# Patient Record
Sex: Female | Born: 1960 | Race: White | Hispanic: No | Marital: Married | Smoking: Never smoker
Health system: Southern US, Community
[De-identification: ages and names within clinical notes are randomized; demographics above are authoritative.]

## PROBLEM LIST (undated history)

## (undated) DIAGNOSIS — M1711 Unilateral primary osteoarthritis, right knee: Secondary | ICD-10-CM

## (undated) DIAGNOSIS — G4733 Obstructive sleep apnea (adult) (pediatric): Secondary | ICD-10-CM

## (undated) DIAGNOSIS — Z87442 Personal history of urinary calculi: Secondary | ICD-10-CM

## (undated) DIAGNOSIS — F329 Major depressive disorder, single episode, unspecified: Secondary | ICD-10-CM

## (undated) DIAGNOSIS — E079 Disorder of thyroid, unspecified: Secondary | ICD-10-CM

## (undated) DIAGNOSIS — K219 Gastro-esophageal reflux disease without esophagitis: Secondary | ICD-10-CM

## (undated) DIAGNOSIS — B1920 Unspecified viral hepatitis C without hepatic coma: Secondary | ICD-10-CM

## (undated) DIAGNOSIS — F32A Depression, unspecified: Secondary | ICD-10-CM

## (undated) DIAGNOSIS — M549 Dorsalgia, unspecified: Secondary | ICD-10-CM

## (undated) DIAGNOSIS — E039 Hypothyroidism, unspecified: Secondary | ICD-10-CM

## (undated) DIAGNOSIS — R51 Headache: Secondary | ICD-10-CM

## (undated) DIAGNOSIS — Z8719 Personal history of other diseases of the digestive system: Secondary | ICD-10-CM

## (undated) DIAGNOSIS — F419 Anxiety disorder, unspecified: Secondary | ICD-10-CM

## (undated) DIAGNOSIS — J189 Pneumonia, unspecified organism: Secondary | ICD-10-CM

## (undated) DIAGNOSIS — M1712 Unilateral primary osteoarthritis, left knee: Secondary | ICD-10-CM

## (undated) HISTORY — DX: Unilateral primary osteoarthritis, right knee: M17.11

## (undated) HISTORY — PX: ABDOMINAL HYSTERECTOMY: SHX81

## (undated) HISTORY — DX: Dorsalgia, unspecified: M54.9

## (undated) HISTORY — DX: Unspecified viral hepatitis C without hepatic coma: B19.20

## (undated) HISTORY — DX: Depression, unspecified: F32.A

## (undated) HISTORY — PX: COLONOSCOPY: SHX174

## (undated) HISTORY — PX: KNEE ARTHROSCOPY: SUR90

## (undated) HISTORY — DX: Gastro-esophageal reflux disease without esophagitis: K21.9

## (undated) HISTORY — DX: Disorder of thyroid, unspecified: E07.9

## (undated) HISTORY — DX: Obstructive sleep apnea (adult) (pediatric): G47.33

## (undated) HISTORY — DX: Anxiety disorder, unspecified: F41.9

## (undated) HISTORY — DX: Major depressive disorder, single episode, unspecified: F32.9

## (undated) HISTORY — PX: TONSILLECTOMY: SUR1361

## (undated) HISTORY — PX: KNEE ARTHROSCOPY: SHX127

---
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
## 1997-01-06 HISTORY — PX: RADICAL HYSTERECTOMY: SHX2283

## 1997-01-06 HISTORY — PX: KNEE ARTHROSCOPY: SUR90

## 2003-01-07 HISTORY — PX: CHOLECYSTECTOMY: SHX55

## 2007-07-28 ENCOUNTER — Encounter: Admission: RE | Admit: 2007-07-28 | Discharge: 2007-07-28 | Payer: Self-pay | Admitting: Orthopedic Surgery

## 2008-01-07 HISTORY — PX: KNEE ARTHROSCOPY: SHX127

## 2012-11-10 ENCOUNTER — Other Ambulatory Visit: Payer: Self-pay | Admitting: Physician Assistant

## 2012-11-10 ENCOUNTER — Encounter: Payer: Self-pay | Admitting: Physician Assistant

## 2012-11-10 DIAGNOSIS — F419 Anxiety disorder, unspecified: Secondary | ICD-10-CM

## 2012-11-10 DIAGNOSIS — G4733 Obstructive sleep apnea (adult) (pediatric): Secondary | ICD-10-CM

## 2012-11-10 DIAGNOSIS — E079 Disorder of thyroid, unspecified: Secondary | ICD-10-CM | POA: Insufficient documentation

## 2012-11-10 DIAGNOSIS — B1920 Unspecified viral hepatitis C without hepatic coma: Secondary | ICD-10-CM | POA: Insufficient documentation

## 2012-11-10 DIAGNOSIS — M549 Dorsalgia, unspecified: Secondary | ICD-10-CM | POA: Insufficient documentation

## 2012-11-10 DIAGNOSIS — K219 Gastro-esophageal reflux disease without esophagitis: Secondary | ICD-10-CM | POA: Insufficient documentation

## 2012-11-10 DIAGNOSIS — M1711 Unilateral primary osteoarthritis, right knee: Secondary | ICD-10-CM | POA: Insufficient documentation

## 2012-11-10 NOTE — H&P (Addendum)
TOTAL KNEE ADMISSION H&P  Patient is being admitted for right total knee arthroplasty.  Subjective:  Chief Complaint:right knee pain.  HPI: [name] Mcgee, 52 y.o. female, has a history of pain and functional disability in the right knee due to arthritis and has failed non-surgical conservative treatments for greater than 12 weeks to includeNSAID's and/or analgesics, corticosteriod injections, viscosupplementation injections, flexibility and strengthening excercises, use of assistive devices, weight reduction as appropriate and activity modification.  Onset of symptoms was gradual, starting >10 years ago with gradually worsening course since that time. The patient noted prior procedures on the knee to include  arthroscopy and menisectomy on the right knee(s).  Patient currently rates pain in the right knee(s) at 10 out of 10 with activity. Patient has night pain, worsening of pain with activity and weight bearing, pain that interferes with activities of daily living, crepitus and joint swelling.  Patient has evidence of subchondral sclerosis, periarticular osteophytes and joint space narrowing by imaging studies.  There is no active infection.  Patient Active Problem List   Diagnosis Date Noted  . Back pain   . Right knee DJD   . Thyroid disease   . Hepatitis C   . GERD (gastroesophageal reflux disease)   . Anxiety   . Sleep apnea, obstructive    Past Medical History  Diagnosis Date  . Back pain   . Right knee DJD   . Thyroid disease   . Depression   . Hepatitis C   . GERD (gastroesophageal reflux disease)   . Anxiety   . Sleep apnea, obstructive     wears a CPAP at night    Past Surgical History  Procedure Laterality Date  . Cholecystectomy  2005  . Radical hysterectomy  1999  . Knee arthroscopy Right 1999  . Knee arthroscopy Right   . Knee arthroscopy Right 2010  . Knee arthroscopy Left   . Tonsilectomy/adenoidectomy with myringotomy  1975     (Not in a hospital  admission) No Known Allergies    Current outpatient prescriptions:ALPRAZolam (XANAX) 1 MG tablet, Take 1 mg by mouth 3 (three) times daily., Disp: , Rfl: ;  amitriptyline (ELAVIL) 10 MG tablet, Take 20 mg by mouth at bedtime., Disp: , Rfl: ;  ARIPiprazole (ABILIFY) 15 MG tablet, Take 15 mg by mouth daily., Disp: , Rfl: ;  DULoxetine (CYMBALTA) 60 MG capsule, Take 60 mg by mouth 2 (two) times daily., Disp: , Rfl:  gabapentin (NEURONTIN) 800 MG tablet, Take 800 mg by mouth 4 (four) times daily - after meals and at bedtime., Disp: , Rfl: ;  HYDROcodone-acetaminophen (NORCO/VICODIN) 5-325 MG per tablet, Take 1 tablet by mouth every 6 (six) hours as needed for moderate pain., Disp: , Rfl: ;  lansoprazole (PREVACID) 30 MG capsule, Take 30 mg by mouth daily at 12 noon., Disp: , Rfl:  levothyroxine (SYNTHROID, LEVOTHROID) 112 MCG tablet, Take 112 mcg by mouth daily before breakfast., Disp: , Rfl:   History  Substance Use Topics  . Smoking status: Never Smoker   . Smokeless tobacco: Not on file  . Alcohol Use: Yes     Comment: social    Family History  Problem Relation Age of Onset  . Heart disease Father   . Heart attack Father   . Hypertension Father   . Diabetes Father   . Kidney disease Father   . Heart attack Maternal Grandfather      Review of Systems  Constitutional: Negative.   HENT: Negative.   Eyes:   Negative.   Respiratory: Negative.   Cardiovascular: Negative.   Genitourinary: Negative.   Musculoskeletal: Positive for back pain and joint pain.  Skin: Negative.   Neurological: Negative.   Endo/Heme/Allergies: Negative.   Psychiatric/Behavioral: Positive for depression. Negative for suicidal ideas, hallucinations, memory loss and substance abuse. The patient is nervous/anxious. The patient does not have insomnia.     Objective:  Physical Exam  Constitutional: She is oriented to person, place, and time. She appears well-developed and well-nourished.  HENT:  Head:  Normocephalic and atraumatic.  Mouth/Throat: Oropharynx is clear and moist.  Eyes: Conjunctivae are normal. Pupils are equal, round, and reactive to light.  Neck: Normal range of motion. Neck supple.  Cardiovascular: Normal rate, regular rhythm and normal heart sounds.   Respiratory: Effort normal and breath sounds normal.  GI: Soft. Bowel sounds are normal.  Genitourinary:  Not pertinent to current symptomatology therefore not examined.  Musculoskeletal:  She ambulates with a moderate antalgic gait. She has active range of motion -5-110 degrees bilaterally 1+ crepitus bilaterally 1+ synovitis bilaterally medial joint line tenderness bilaterally normal patella tracking bilaterally 2+dorsalis pedis pulses bilaterally.  Neurological: She is alert and oriented to person, place, and time.  Skin: Skin is warm and dry.  Psychiatric: She has a normal mood and affect. Her behavior is normal.    Vital signs in last 24 hours: Last recorded: 11/05 1300   BP: 139/97 Pulse: 104  Temp: 97.8 F (36.6 C)    Height: 5' 3" (1.6 m) SpO2: 98  Weight: 121.564 kg (268 lb)     Labs:   Estimated body mass index is 47.49 kg/(m^2) as calculated from the following:   Height as of this encounter: 5' 3" (1.6 m).   Weight as of this encounter: 121.564 kg (268 lb).   Imaging Review Plain radiographs demonstrate severe degenerative joint disease of the right knee(s). The overall alignment ismild varus. The bone quality appears to be good for age and reported activity level.  Assessment/Plan:  End stage arthritis, right knee   The patient history, physical examination, clinical judgment of the provider and imaging studies are consistent with end stage degenerative joint disease of the right knee(s) and total knee arthroplasty is deemed medically necessary. The treatment options including medical management, injection therapy arthroscopy and arthroplasty were discussed at length. The risks and benefits of  total knee arthroplasty were presented and reviewed. The risks due to aseptic loosening, infection, stiffness, patella tracking problems, thromboembolic complications and other imponderables were discussed. The patient acknowledged the explanation, agreed to proceed with the plan and consent was signed. Patient is being admitted for inpatient treatment for surgery, pain control, PT, OT, prophylactic antibiotics, VTE prophylaxis, progressive ambulation and ADL's and discharge planning. The patient is planning to be discharged home with home health services  Taurus Willis A. Daianna Vasques, PA-C Physician Assistant [facility] [phone]  11/10/2012, 2:18 PM   

## 2012-11-15 ENCOUNTER — Encounter (HOSPITAL_COMMUNITY): Payer: Self-pay

## 2012-11-16 ENCOUNTER — Other Ambulatory Visit (HOSPITAL_COMMUNITY): Payer: Self-pay | Admitting: *Deleted

## 2012-11-16 ENCOUNTER — Encounter (HOSPITAL_COMMUNITY)
Admission: RE | Admit: 2012-11-16 | Discharge: 2012-11-16 | Disposition: A | Discharge status: Home / Self Care | Payer: No Typology Code available for payment source | Source: Ambulatory Visit | Attending: Orthopedic Surgery | Admitting: Orthopedic Surgery

## 2012-11-16 ENCOUNTER — Encounter (HOSPITAL_COMMUNITY): Payer: Self-pay

## 2012-11-16 ENCOUNTER — Ambulatory Visit (HOSPITAL_COMMUNITY)
Admission: RE | Admit: 2012-11-16 | Discharge: 2012-11-16 | Disposition: A | Discharge status: Home / Self Care | Payer: No Typology Code available for payment source | Source: Ambulatory Visit | Attending: Physician Assistant | Admitting: Physician Assistant

## 2012-11-16 DIAGNOSIS — G4733 Obstructive sleep apnea (adult) (pediatric): Secondary | ICD-10-CM | POA: Insufficient documentation

## 2012-11-16 DIAGNOSIS — E039 Hypothyroidism, unspecified: Secondary | ICD-10-CM | POA: Insufficient documentation

## 2012-11-16 DIAGNOSIS — K219 Gastro-esophageal reflux disease without esophagitis: Secondary | ICD-10-CM | POA: Insufficient documentation

## 2012-11-16 DIAGNOSIS — Z01810 Encounter for preprocedural cardiovascular examination: Secondary | ICD-10-CM | POA: Insufficient documentation

## 2012-11-16 HISTORY — DX: Personal history of urinary calculi: Z87.442

## 2012-11-16 HISTORY — DX: Headache: R51

## 2012-11-16 HISTORY — DX: Personal history of other diseases of the digestive system: Z87.19

## 2012-11-16 HISTORY — DX: Hypothyroidism, unspecified: E03.9

## 2012-11-16 LAB — COMPREHENSIVE METABOLIC PANEL
ALT: 25 U/L (ref 0–35)
Albumin: 3.9 g/dL (ref 3.5–5.2)
Alkaline Phosphatase: 120 U/L — ABNORMAL HIGH (ref 39–117)
BUN: 10 mg/dL (ref 6–23)
CO2: 28 mEq/L (ref 19–32)
Chloride: 101 mEq/L (ref 96–112)
GFR calc Af Amer: 83 mL/min — ABNORMAL LOW (ref >90)
Glucose, Bld: 72 mg/dL (ref 70–99)
Potassium: 4.5 mEq/L (ref 3.5–5.1)
Sodium: 140 mEq/L (ref 135–145)
Total Bilirubin: 0.2 mg/dL — ABNORMAL LOW (ref 0.3–1.2)

## 2012-11-16 LAB — CBC WITH DIFFERENTIAL/PLATELET
Basophils Absolute: 0 10*3/uL (ref 0.0–0.1)
Eosinophils Relative: 8 % — ABNORMAL HIGH (ref 0–5)
Hemoglobin: 12 g/dL (ref 12.0–15.0)
Lymphocytes Relative: 29 % (ref 12–46)
Lymphs Abs: 1.7 10*3/uL (ref 0.7–4.0)
MCH: 27.9 pg (ref 26.0–34.0)
MCV: 85.3 fL (ref 78.0–100.0)
Monocytes Relative: 8 % (ref 3–12)
Neutro Abs: 3.2 10*3/uL (ref 1.7–7.7)
Neutrophils Relative %: 55 % (ref 43–77)
Platelets: 281 10*3/uL (ref 150–400)
RBC: 4.3 MIL/uL (ref 3.87–5.11)
RDW: 14.7 % (ref 11.5–15.5)
WBC: 5.8 10*3/uL (ref 4.0–10.5)

## 2012-11-16 LAB — TYPE AND SCREEN: Antibody Screen: NEGATIVE

## 2012-11-16 LAB — APTT: aPTT: 30 seconds (ref 24–37)

## 2012-11-16 LAB — URINALYSIS, ROUTINE W REFLEX MICROSCOPIC
Bilirubin Urine: NEGATIVE
Hgb urine dipstick: NEGATIVE
Nitrite: NEGATIVE
Specific Gravity, Urine: 1.008 (ref 1.005–1.030)
Urobilinogen, UA: 0.2 mg/dL (ref 0.0–1.0)
pH: 7 (ref 5.0–8.0)

## 2012-11-16 LAB — SURGICAL PCR SCREEN
MRSA, PCR: NEGATIVE
Staphylococcus aureus: NEGATIVE

## 2012-11-16 LAB — ABO/RH: ABO/RH(D): A POS

## 2012-11-16 LAB — PROTIME-INR: Prothrombin Time: 12.1 seconds (ref 11.6–15.2)

## 2012-11-16 NOTE — Progress Notes (Signed)
Called office re: ted hose too small for patient.

## 2012-11-16 NOTE — Pre-Procedure Instructions (Addendum)
[name] Mcgee  11/16/2012   Your procedure is scheduled on:  11/22/12  Report to [facility] short stay admitting at 1045 AM.  Call this number if you have problems the morning of surgery: [phone]   Remember:   Do not eat food or drink liquids after midnight.   Take these medicines the morning of surgery with A SIP OF WATER: xanax if need, abilify,cymbalta,gabapentin,pain med if need, prevcid,levothyroxine                  STOP all herbel meds, nsaids (aleve,naproxen,advil,ibuprofen) 5 days prior to surgery   Do not wear jewelry, make-up or nail polish.  Do not wear lotions, powders, or perfumes. You may wear deodorant.  Do not shave 48 hours prior to surgery. Men may shave face and neck.  Do not bring valuables to the hospital.  [facility] is not responsible                  for any belongings or valuables.               Contacts, dentures or bridgework may not be worn into surgery.  Leave suitcase in the car. After surgery it may be brought to your room.  For patients admitted to the hospital, discharge time is determined by your                treatment team.               Patients discharged the day of surgery will not be allowed to drive  home.  Name and phone number of your driver:   Special Instructions: Shower using CHG 2 nights before surgery and the night before surgery.  If you shower the day of surgery use CHG.  Use special wash - you have one bottle of CHG for all showers.  You should use approximately 1/3 of the bottle for each shower.   Please read over the following fact sheets that you were given: Pain Booklet, Coughing and Deep Breathing, Blood Transfusion Information, Total Joint Packet, MRSA Information and Surgical Site Infection Prevention

## 2012-11-16 NOTE — Progress Notes (Addendum)
req'd sleep study [facility] req'd ekg, office notes from dr mark Leary Roca [facility]

## 2012-11-17 LAB — URINE CULTURE

## 2012-11-17 NOTE — Progress Notes (Signed)
Anesthesia Chart Review:  Patient is a 52 year old female scheduled for right TKR on 11/22/12 by Dr. Thurston Hole.  History includes morbid obesity, non-smoker, Hepatitis C, GERD, hypothyroidism, anxiety, depression, hiatal hernia, nephrolithiasis, headaches, OSA, hysterectomy, T&A, cholecystectomy. PCP is with [facility] in [place], [place].  Preoperative labs and CXR noted.  EKG on 11/16/12 showed NSR, cannot rule out anterior infarct (age undetermined). There are no comparison EKG in Epic/Muse and none received from Dr. Thomes Lolling office.  No CV symptoms were documented at her PAT visit.  She is obese, but no known history of DM, CAD/MI, or CHF.  She will be further evaluated by her assigned anesthesiologist on the day of surgery, but if she remains asymptomatic from a CV standpoint then I would anticipate that she could proceed as planned.  Velna Ochs [facility]/Anesthesiology Phone [phone] 11/17/2012 6:42 PM

## 2012-11-19 NOTE — Progress Notes (Signed)
Notified patient of time change and instructed her to arrive at 900 am. 11/22/12

## 2012-11-21 MED ORDER — DEXTROSE 5 % IV SOLN
3.0000 g | INTRAVENOUS | Status: DC
  Filled 2012-11-21: qty 3000

## 2012-11-22 ENCOUNTER — Inpatient Hospital Stay (HOSPITAL_COMMUNITY)
Admission: RE | Admit: 2012-11-22 | Discharge: 2012-11-23 | DRG: 470 | Disposition: A | Discharge status: Home Health | Payer: No Typology Code available for payment source | Source: Ambulatory Visit | Attending: Orthopedic Surgery | Admitting: Orthopedic Surgery

## 2012-11-22 ENCOUNTER — Inpatient Hospital Stay (HOSPITAL_COMMUNITY): Payer: No Typology Code available for payment source | Admitting: Anesthesiology

## 2012-11-22 ENCOUNTER — Encounter (HOSPITAL_COMMUNITY): Payer: Self-pay

## 2012-11-22 ENCOUNTER — Encounter (HOSPITAL_COMMUNITY): Payer: No Typology Code available for payment source | Admitting: Vascular Surgery

## 2012-11-22 ENCOUNTER — Encounter (HOSPITAL_COMMUNITY)
Admission: RE | Disposition: A | Discharge status: Home Health | Payer: Self-pay | Source: Ambulatory Visit | Attending: Orthopedic Surgery

## 2012-11-22 DIAGNOSIS — M1711 Unilateral primary osteoarthritis, right knee: Secondary | ICD-10-CM | POA: Diagnosis present

## 2012-11-22 DIAGNOSIS — M1710 Unilateral primary osteoarthritis, unspecified knee: Principal | ICD-10-CM | POA: Diagnosis present

## 2012-11-22 DIAGNOSIS — M549 Dorsalgia, unspecified: Secondary | ICD-10-CM | POA: Diagnosis present

## 2012-11-22 DIAGNOSIS — F3289 Other specified depressive episodes: Secondary | ICD-10-CM | POA: Diagnosis present

## 2012-11-22 DIAGNOSIS — E079 Disorder of thyroid, unspecified: Secondary | ICD-10-CM | POA: Diagnosis present

## 2012-11-22 DIAGNOSIS — Z8249 Family history of ischemic heart disease and other diseases of the circulatory system: Secondary | ICD-10-CM

## 2012-11-22 DIAGNOSIS — F419 Anxiety disorder, unspecified: Secondary | ICD-10-CM | POA: Diagnosis present

## 2012-11-22 DIAGNOSIS — F329 Major depressive disorder, single episode, unspecified: Secondary | ICD-10-CM | POA: Diagnosis present

## 2012-11-22 DIAGNOSIS — G4733 Obstructive sleep apnea (adult) (pediatric): Secondary | ICD-10-CM | POA: Diagnosis present

## 2012-11-22 DIAGNOSIS — Z833 Family history of diabetes mellitus: Secondary | ICD-10-CM

## 2012-11-22 DIAGNOSIS — Z87442 Personal history of urinary calculi: Secondary | ICD-10-CM

## 2012-11-22 DIAGNOSIS — B1920 Unspecified viral hepatitis C without hepatic coma: Secondary | ICD-10-CM | POA: Diagnosis present

## 2012-11-22 DIAGNOSIS — K219 Gastro-esophageal reflux disease without esophagitis: Secondary | ICD-10-CM | POA: Diagnosis present

## 2012-11-22 DIAGNOSIS — F411 Generalized anxiety disorder: Secondary | ICD-10-CM | POA: Diagnosis present

## 2012-11-22 DIAGNOSIS — E039 Hypothyroidism, unspecified: Secondary | ICD-10-CM | POA: Diagnosis present

## 2012-11-22 HISTORY — PX: TOTAL KNEE ARTHROPLASTY: SHX125

## 2012-11-22 LAB — HEMOGLOBIN A1C
Hgb A1c MFr Bld: 6.1 % — ABNORMAL HIGH (ref <5.7)
Mean Plasma Glucose: 128 mg/dL — ABNORMAL HIGH (ref <117)

## 2012-11-22 LAB — GLUCOSE, CAPILLARY: Glucose-Capillary: 126 mg/dL — ABNORMAL HIGH (ref 70–99)

## 2012-11-22 SURGERY — ARTHROPLASTY, KNEE, TOTAL
Anesthesia: General | Site: Knee | Laterality: Right | Wound class: Clean

## 2012-11-22 MED ORDER — METOCLOPRAMIDE HCL 5 MG/ML IJ SOLN: 5.0000 mg | Freq: Three times a day (TID) | INTRAMUSCULAR | Status: DC | PRN

## 2012-11-22 MED ORDER — ZOLPIDEM TARTRATE 5 MG PO TABS: 5.0000 mg | ORAL_TABLET | Freq: Every evening | ORAL | Status: DC | PRN

## 2012-11-22 MED ORDER — ROCURONIUM BROMIDE 100 MG/10ML IV SOLN
INTRAVENOUS | Status: DC | PRN
  Administered 2012-11-22: 50 mg via INTRAVENOUS
  Administered 2012-11-22: 10 mg via INTRAVENOUS

## 2012-11-22 MED ORDER — BUPIVACAINE-EPINEPHRINE PF 0.25-1:200000 % IJ SOLN
INTRAMUSCULAR | Status: AC
  Filled 2012-11-22: qty 30

## 2012-11-22 MED ORDER — ACETAMINOPHEN 650 MG RE SUPP: 650.0000 mg | Freq: Four times a day (QID) | RECTAL | Status: DC | PRN

## 2012-11-22 MED ORDER — DULOXETINE HCL 60 MG PO CPEP
60.0000 mg | ORAL_CAPSULE | Freq: Two times a day (BID) | ORAL | Status: DC
  Administered 2012-11-22 – 2012-11-23 (×2): 60 mg via ORAL
  Filled 2012-11-22 (×4): qty 1

## 2012-11-22 MED ORDER — DEXAMETHASONE 6 MG PO TABS
10.0000 mg | ORAL_TABLET | Freq: Three times a day (TID) | ORAL | Status: AC
  Administered 2012-11-23: 05:00:00 10 mg via ORAL
  Filled 2012-11-22 (×4): qty 1

## 2012-11-22 MED ORDER — LEVOTHYROXINE SODIUM 112 MCG PO TABS
112.0000 ug | ORAL_TABLET | Freq: Every day | ORAL | Status: DC
  Administered 2012-11-23: 112 ug via ORAL
  Filled 2012-11-22 (×2): qty 1

## 2012-11-22 MED ORDER — ALPRAZOLAM 0.5 MG PO TABS
1.0000 mg | ORAL_TABLET | Freq: Three times a day (TID) | ORAL | Status: DC
  Administered 2012-11-22 – 2012-11-23 (×3): 1 mg via ORAL
  Filled 2012-11-22 (×3): qty 2

## 2012-11-22 MED ORDER — BISACODYL 5 MG PO TBEC
10.0000 mg | DELAYED_RELEASE_TABLET | Freq: Every day | ORAL | Status: DC
  Administered 2012-11-22: 10 mg via ORAL
  Filled 2012-11-22: qty 2

## 2012-11-22 MED ORDER — BUPIVACAINE-EPINEPHRINE 0.25% -1:200000 IJ SOLN
INTRAMUSCULAR | Status: DC | PRN
  Administered 2012-11-22: 30 mL

## 2012-11-22 MED ORDER — OXYCODONE HCL 5 MG/5ML PO SOLN: 5.0000 mg | Freq: Once | ORAL | Status: DC | PRN

## 2012-11-22 MED ORDER — PROPOFOL 10 MG/ML IV BOLUS
INTRAVENOUS | Status: DC | PRN
  Administered 2012-11-22: 200 mg via INTRAVENOUS

## 2012-11-22 MED ORDER — CEFAZOLIN SODIUM-DEXTROSE 2-3 GM-% IV SOLR
INTRAVENOUS | Status: DC | PRN
  Administered 2012-11-22: 2 g via INTRAVENOUS

## 2012-11-22 MED ORDER — BUPIVACAINE-EPINEPHRINE PF 0.5-1:200000 % IJ SOLN
INTRAMUSCULAR | Status: DC | PRN
  Administered 2012-11-22: 25 mL

## 2012-11-22 MED ORDER — METOCLOPRAMIDE HCL 10 MG PO TABS: 5.0000 mg | ORAL_TABLET | Freq: Three times a day (TID) | ORAL | Status: DC | PRN

## 2012-11-22 MED ORDER — CELECOXIB 200 MG PO CAPS
200.0000 mg | ORAL_CAPSULE | Freq: Two times a day (BID) | ORAL | Status: DC
  Administered 2012-11-22 – 2012-11-23 (×3): 200 mg via ORAL
  Filled 2012-11-22 (×4): qty 1

## 2012-11-22 MED ORDER — ALUM & MAG HYDROXIDE-SIMETH 200-200-20 MG/5ML PO SUSP: 30.0000 mL | ORAL | Status: DC | PRN

## 2012-11-22 MED ORDER — GLYCOPYRROLATE 0.2 MG/ML IJ SOLN
INTRAMUSCULAR | Status: DC | PRN
  Administered 2012-11-22: 0.4 mg via INTRAVENOUS

## 2012-11-22 MED ORDER — OXYCODONE HCL 5 MG PO TABS
5.0000 mg | ORAL_TABLET | ORAL | Status: DC | PRN
  Administered 2012-11-22 – 2012-11-23 (×2): 10 mg via ORAL
  Filled 2012-11-22 (×2): qty 2

## 2012-11-22 MED ORDER — HYDROMORPHONE HCL PF 1 MG/ML IJ SOLN: 1.0000 mg | INTRAMUSCULAR | Status: DC | PRN

## 2012-11-22 MED ORDER — DEXAMETHASONE SODIUM PHOSPHATE 10 MG/ML IJ SOLN
INTRAMUSCULAR | Status: DC | PRN
  Administered 2012-11-22: 10 mg via INTRAVENOUS

## 2012-11-22 MED ORDER — FENTANYL CITRATE 0.05 MG/ML IJ SOLN
INTRAMUSCULAR | Status: DC | PRN
  Administered 2012-11-22: 100 ug via INTRAVENOUS
  Administered 2012-11-22: 50 ug via INTRAVENOUS
  Administered 2012-11-22: 100 ug via INTRAVENOUS

## 2012-11-22 MED ORDER — CEFAZOLIN SODIUM-DEXTROSE 2-3 GM-% IV SOLR
2.0000 g | Freq: Four times a day (QID) | INTRAVENOUS | Status: AC
  Administered 2012-11-22 (×2): 2 g via INTRAVENOUS
  Filled 2012-11-22 (×4): qty 50

## 2012-11-22 MED ORDER — DOCUSATE SODIUM 100 MG PO CAPS
100.0000 mg | ORAL_CAPSULE | Freq: Two times a day (BID) | ORAL | Status: DC
  Administered 2012-11-22 – 2012-11-23 (×3): 100 mg via ORAL
  Filled 2012-11-22 (×3): qty 1

## 2012-11-22 MED ORDER — NEOSTIGMINE METHYLSULFATE 1 MG/ML IJ SOLN
INTRAMUSCULAR | Status: DC | PRN
  Administered 2012-11-22: 3 mg via INTRAVENOUS

## 2012-11-22 MED ORDER — ACETAMINOPHEN 325 MG PO TABS: 650.0000 mg | ORAL_TABLET | Freq: Four times a day (QID) | ORAL | Status: DC | PRN

## 2012-11-22 MED ORDER — POTASSIUM CHLORIDE IN NACL 20-0.9 MEQ/L-% IV SOLN
INTRAVENOUS | Status: DC
  Administered 2012-11-22 – 2012-11-23 (×2): via INTRAVENOUS
  Filled 2012-11-22 (×4): qty 1000

## 2012-11-22 MED ORDER — ONDANSETRON HCL 4 MG/2ML IJ SOLN
INTRAMUSCULAR | Status: DC | PRN
  Administered 2012-11-22: 4 mg via INTRAVENOUS

## 2012-11-22 MED ORDER — LIDOCAINE HCL (CARDIAC) 20 MG/ML IV SOLN
INTRAVENOUS | Status: DC | PRN
  Administered 2012-11-22: 100 mg via INTRAVENOUS

## 2012-11-22 MED ORDER — CHLORHEXIDINE GLUCONATE 4 % EX LIQD: 60.0000 mL | Freq: Once | CUTANEOUS | Status: DC

## 2012-11-22 MED ORDER — OXYCODONE HCL 5 MG PO TABS: 5.0000 mg | ORAL_TABLET | Freq: Once | ORAL | Status: DC | PRN

## 2012-11-22 MED ORDER — LACTATED RINGERS IV SOLN
INTRAVENOUS | Status: DC | PRN
  Administered 2012-11-22 (×2): via INTRAVENOUS

## 2012-11-22 MED ORDER — VITAMIN D 50 MCG (2000 UT) PO TABS: 2000.0000 [IU] | ORAL_TABLET | Freq: Every day | ORAL | Status: DC

## 2012-11-22 MED ORDER — PANTOPRAZOLE SODIUM 40 MG PO TBEC
40.0000 mg | DELAYED_RELEASE_TABLET | Freq: Every day | ORAL | Status: DC
  Administered 2012-11-23: 40 mg via ORAL
  Filled 2012-11-22: qty 1

## 2012-11-22 MED ORDER — EPHEDRINE SULFATE 50 MG/ML IJ SOLN
INTRAMUSCULAR | Status: DC | PRN
  Administered 2012-11-22: 10 mg via INTRAVENOUS

## 2012-11-22 MED ORDER — POVIDONE-IODINE 7.5 % EX SOLN
Freq: Once | CUTANEOUS | Status: DC
  Filled 2012-11-22: qty 118

## 2012-11-22 MED ORDER — DEXAMETHASONE SODIUM PHOSPHATE 10 MG/ML IJ SOLN
10.0000 mg | Freq: Three times a day (TID) | INTRAMUSCULAR | Status: AC
  Administered 2012-11-22 (×2): 10 mg via INTRAVENOUS
  Filled 2012-11-22 (×3): qty 1

## 2012-11-22 MED ORDER — LACTATED RINGERS IV SOLN
INTRAVENOUS | Status: DC
  Administered 2012-11-22: 10:00:00 via INTRAVENOUS

## 2012-11-22 MED ORDER — ONDANSETRON HCL 4 MG/2ML IJ SOLN: 4.0000 mg | Freq: Four times a day (QID) | INTRAMUSCULAR | Status: DC | PRN

## 2012-11-22 MED ORDER — HYDROMORPHONE HCL PF 1 MG/ML IJ SOLN
0.2500 mg | INTRAMUSCULAR | Status: DC | PRN
  Administered 2012-11-22: 0.5 mg via INTRAVENOUS

## 2012-11-22 MED ORDER — HYDROMORPHONE HCL PF 1 MG/ML IJ SOLN
INTRAMUSCULAR | Status: AC
  Filled 2012-11-22: qty 1

## 2012-11-22 MED ORDER — FENTANYL CITRATE 0.05 MG/ML IJ SOLN
INTRAMUSCULAR | Status: AC
  Administered 2012-11-22: 50 ug
  Filled 2012-11-22: qty 2

## 2012-11-22 MED ORDER — OXYCODONE HCL ER 20 MG PO T12A
20.0000 mg | EXTENDED_RELEASE_TABLET | Freq: Two times a day (BID) | ORAL | Status: DC
  Administered 2012-11-22 – 2012-11-23 (×3): 20 mg via ORAL
  Filled 2012-11-22 (×3): qty 2

## 2012-11-22 MED ORDER — TRANEXAMIC ACID 100 MG/ML IV SOLN
1000.0000 mg | INTRAVENOUS | Status: AC
  Administered 2012-11-22: 1000 mg via INTRAVENOUS
  Filled 2012-11-22: qty 10

## 2012-11-22 MED ORDER — INSULIN ASPART 100 UNIT/ML ~~LOC~~ SOLN
0.0000 [IU] | Freq: Three times a day (TID) | SUBCUTANEOUS | Status: DC
  Administered 2012-11-23 (×2): 3 [IU] via SUBCUTANEOUS

## 2012-11-22 MED ORDER — PHENOL 1.4 % MT LIQD: 1.0000 | OROMUCOSAL | Status: DC | PRN

## 2012-11-22 MED ORDER — METOCLOPRAMIDE HCL 5 MG/ML IJ SOLN: 10.0000 mg | Freq: Once | INTRAMUSCULAR | Status: DC | PRN

## 2012-11-22 MED ORDER — SODIUM CHLORIDE 0.9 % IR SOLN
Status: DC | PRN
  Administered 2012-11-22: 3000 mL

## 2012-11-22 MED ORDER — ONDANSETRON HCL 4 MG PO TABS: 4.0000 mg | ORAL_TABLET | Freq: Four times a day (QID) | ORAL | Status: DC | PRN

## 2012-11-22 MED ORDER — VITAMIN D3 25 MCG (1000 UNIT) PO TABS
2000.0000 [IU] | ORAL_TABLET | Freq: Every day | ORAL | Status: DC
  Administered 2012-11-23: 2000 [IU] via ORAL
  Filled 2012-11-22: qty 2

## 2012-11-22 MED ORDER — GABAPENTIN 400 MG PO CAPS
800.0000 mg | ORAL_CAPSULE | Freq: Three times a day (TID) | ORAL | Status: DC
  Administered 2012-11-22 – 2012-11-23 (×4): 800 mg via ORAL
  Filled 2012-11-22 (×8): qty 2

## 2012-11-22 MED ORDER — AMITRIPTYLINE HCL 100 MG PO TABS
100.0000 mg | ORAL_TABLET | Freq: Every day | ORAL | Status: DC
  Administered 2012-11-22: 100 mg via ORAL
  Filled 2012-11-22 (×2): qty 1

## 2012-11-22 MED ORDER — ARIPIPRAZOLE 15 MG PO TABS
15.0000 mg | ORAL_TABLET | Freq: Every day | ORAL | Status: DC
  Administered 2012-11-23: 15 mg via ORAL
  Filled 2012-11-22: qty 1

## 2012-11-22 MED ORDER — DIPHENHYDRAMINE HCL 12.5 MG/5ML PO ELIX: 12.5000 mg | ORAL_SOLUTION | ORAL | Status: DC | PRN

## 2012-11-22 MED ORDER — MENTHOL 3 MG MT LOZG: 1.0000 | LOZENGE | OROMUCOSAL | Status: DC | PRN

## 2012-11-22 MED ORDER — ASPIRIN EC 325 MG PO TBEC
325.0000 mg | DELAYED_RELEASE_TABLET | Freq: Every day | ORAL | Status: DC
  Administered 2012-11-23: 325 mg via ORAL
  Filled 2012-11-22 (×2): qty 1

## 2012-11-22 MED ORDER — INSULIN ASPART 100 UNIT/ML ~~LOC~~ SOLN: 0.0000 [IU] | Freq: Every day | SUBCUTANEOUS | Status: DC

## 2012-11-22 MED ORDER — GABAPENTIN 800 MG PO TABS
800.0000 mg | ORAL_TABLET | Freq: Three times a day (TID) | ORAL | Status: DC
  Filled 2012-11-22 (×3): qty 1

## 2012-11-22 MED ORDER — MIDAZOLAM HCL 2 MG/2ML IJ SOLN
INTRAMUSCULAR | Status: AC
  Administered 2012-11-22: 2 mg
  Filled 2012-11-22: qty 2

## 2012-11-22 SURGICAL SUPPLY — 63 items
BANDAGE ESMARK 6X9 LF (GAUZE/BANDAGES/DRESSINGS) ×1 IMPLANT
BLADE SAGITTAL 25.0X1.19X90 (BLADE) ×2 IMPLANT
BLADE SAW SGTL 11.0X1.19X90.0M (BLADE) IMPLANT
BLADE SAW SGTL 13.0X1.19X90.0M (BLADE) ×2 IMPLANT
BLADE SURG 10 STRL SS (BLADE) ×4 IMPLANT
BNDG ELASTIC 6X15 VLCR STRL LF (GAUZE/BANDAGES/DRESSINGS) ×2 IMPLANT
BNDG ESMARK 6X9 LF (GAUZE/BANDAGES/DRESSINGS) ×2
BOWL SMART MIX CTS (DISPOSABLE) ×2 IMPLANT
CAPT RP KNEE ×2 IMPLANT
CEMENT HV SMART SET (Cement) ×4 IMPLANT
CLOTH BEACON ORANGE TIMEOUT ST (SAFETY) IMPLANT
COVER SURGICAL LIGHT HANDLE (MISCELLANEOUS) ×2 IMPLANT
CUFF TOURNIQUET SINGLE 34IN LL (TOURNIQUET CUFF) IMPLANT
CUFF TOURNIQUET SINGLE 44IN (TOURNIQUET CUFF) ×2 IMPLANT
DRAPE EXTREMITY T 121X128X90 (DRAPE) ×2 IMPLANT
DRAPE INCISE IOBAN 66X45 STRL (DRAPES) ×2 IMPLANT
DRAPE PROXIMA HALF (DRAPES) ×2 IMPLANT
DRAPE U-SHAPE 47X51 STRL (DRAPES) ×2 IMPLANT
DRSG ADAPTIC 3X8 NADH LF (GAUZE/BANDAGES/DRESSINGS) ×2 IMPLANT
DRSG PAD ABDOMINAL 8X10 ST (GAUZE/BANDAGES/DRESSINGS) ×4 IMPLANT
DURAPREP 26ML APPLICATOR (WOUND CARE) ×2 IMPLANT
ELECT CAUTERY BLADE 6.4 (BLADE) ×2 IMPLANT
ELECT REM PT RETURN 9FT ADLT (ELECTROSURGICAL) ×2
ELECTRODE REM PT RTRN 9FT ADLT (ELECTROSURGICAL) ×1 IMPLANT
EVACUATOR 1/8 PVC DRAIN (DRAIN) ×2 IMPLANT
FACESHIELD LNG OPTICON STERILE (SAFETY) ×2 IMPLANT
GLOVE BIO SURGEON STRL SZ7 (GLOVE) ×2 IMPLANT
GLOVE BIOGEL PI IND STRL 7.0 (GLOVE) ×1 IMPLANT
GLOVE BIOGEL PI IND STRL 7.5 (GLOVE) ×1 IMPLANT
GLOVE BIOGEL PI INDICATOR 7.0 (GLOVE) ×1
GLOVE BIOGEL PI INDICATOR 7.5 (GLOVE) ×1
GLOVE SS BIOGEL STRL SZ 7.5 (GLOVE) ×1 IMPLANT
GLOVE SUPERSENSE BIOGEL SZ 7.5 (GLOVE) ×1
GOWN PREVENTION PLUS XLARGE (GOWN DISPOSABLE) ×4 IMPLANT
GOWN STRL NON-REIN LRG LVL3 (GOWN DISPOSABLE) ×4 IMPLANT
HANDPIECE INTERPULSE COAX TIP (DISPOSABLE) ×1
HOOD PEEL AWAY FACE SHEILD DIS (HOOD) ×4 IMPLANT
IMMOBILIZER KNEE 22 UNIV (SOFTGOODS) ×2 IMPLANT
KIT BASIN OR (CUSTOM PROCEDURE TRAY) ×2 IMPLANT
KIT ROOM TURNOVER OR (KITS) ×2 IMPLANT
MANIFOLD NEPTUNE II (INSTRUMENTS) ×2 IMPLANT
NS IRRIG 1000ML POUR BTL (IV SOLUTION) ×2 IMPLANT
PACK TOTAL JOINT (CUSTOM PROCEDURE TRAY) ×2 IMPLANT
PAD ARMBOARD 7.5X6 YLW CONV (MISCELLANEOUS) ×4 IMPLANT
PAD CAST 4YDX4 CTTN HI CHSV (CAST SUPPLIES) ×1 IMPLANT
PADDING CAST COTTON 4X4 STRL (CAST SUPPLIES) ×1
PADDING CAST COTTON 6X4 STRL (CAST SUPPLIES) ×2 IMPLANT
RUBBERBAND STERILE (MISCELLANEOUS) ×2 IMPLANT
SET HNDPC FAN SPRY TIP SCT (DISPOSABLE) ×1 IMPLANT
SPONGE GAUZE 4X4 12PLY (GAUZE/BANDAGES/DRESSINGS) ×2 IMPLANT
STRIP CLOSURE SKIN 1/2X4 (GAUZE/BANDAGES/DRESSINGS) ×2 IMPLANT
SUCTION FRAZIER TIP 10 FR DISP (SUCTIONS) ×2 IMPLANT
SUT ETHIBOND NAB CT1 #1 30IN (SUTURE) ×4 IMPLANT
SUT MNCRL AB 3-0 PS2 18 (SUTURE) ×2 IMPLANT
SUT VIC AB 0 CT1 27 (SUTURE) ×2
SUT VIC AB 0 CT1 27XBRD ANBCTR (SUTURE) ×2 IMPLANT
SUT VIC AB 2-0 CT1 27 (SUTURE) ×2
SUT VIC AB 2-0 CT1 TAPERPNT 27 (SUTURE) ×2 IMPLANT
SYR 30ML SLIP (SYRINGE) ×2 IMPLANT
TOWEL OR 17X24 6PK STRL BLUE (TOWEL DISPOSABLE) ×2 IMPLANT
TOWEL OR 17X26 10 PK STRL BLUE (TOWEL DISPOSABLE) ×2 IMPLANT
TRAY FOLEY CATH 16FR SILVER (SET/KITS/TRAYS/PACK) ×2 IMPLANT
WATER STERILE IRR 1000ML POUR (IV SOLUTION) ×4 IMPLANT

## 2012-11-22 NOTE — Anesthesia Procedure Notes (Signed)
Anesthesia Regional Block:  Femoral nerve block  Pre-Anesthetic Checklist: ,, timeout performed, Correct Patient, Correct Site, Correct Laterality, Correct Procedure, Correct Position, site marked, Risks and benefits discussed,  Surgical consent,  Pre-op evaluation,  At surgeon's request and post-op pain management  Laterality: Right  Prep: chloraprep       Needles:   Needle Type: Other     Needle Length: 9cm  Needle Gauge: 21 and 21 G    Additional Needles:  Procedures: ultrasound guided (picture in chart) Femoral nerve block Narrative:  Start time: 11/22/2012 9:50 AM End time: 11/22/2012 9:58 AM Injection made incrementally with aspirations every 5 mL.  Performed by: Personally  Anesthesiologist: C. Kiran Lapine MD  Additional Notes: Ultrasound guidance used to: id relevant anatomy, confirm needle position, local anesthetic spread, avoidance of vascular puncture. Picture saved. No complications. Block performed personally by Janetta Hora. Gelene Mink, MD    Femoral nerve block

## 2012-11-22 NOTE — Transfer of Care (Signed)
Immediate Anesthesia Transfer of Care Note  Patient: [name] Mcgee  Procedure(s) Performed: Procedure(s): TOTAL KNEE ARTHROPLASTY (Right)  Patient Location: PACU  Anesthesia Type:General  Level of Consciousness: awake and alert   Airway & Oxygen Therapy: Patient Spontanous Breathing and Patient connected to nasal cannula oxygen  Post-op Assessment: Report given to PACU RN and Post -op Vital signs reviewed and stable  Post vital signs: Reviewed and stable  Complications: No apparent anesthesia complications

## 2012-11-22 NOTE — Progress Notes (Signed)
Orthopedic Tech Progress Note Patient Details:  [name] Mcgee [birth year] [id number] CPM applied to Right LE with appropriate settings. OHF applied to bed. Footsie roll provided, patient already has knee immobilizer. CPM Right Knee CPM Right Knee: On Right Knee Flexion (Degrees): 60 Right Knee Extension (Degrees): 0   Asia R Thompson 11/22/2012, 1:50 PM

## 2012-11-22 NOTE — Evaluation (Signed)
Physical Therapy Evaluation Patient Details Name: [name] Mcgee MRN: [id number] DOB: [birth year] Today's Date: 11/22/2012 Time: 4098-1191 PT Time Calculation (min): 23 min  PT Assessment / Plan / Recommendation History of Present Illness  Pt is a 52 y/o female admitted s/p R TKA on 11/22/12.  Clinical Impression  This patient presents with acute pain and decreased functional independence following the above mentioned procedure. At the time of PT eval, pt had good motion of RLE and needed little assistance to initiate movement. Min assist for all transfers and SPT to recliner. This patient is appropriate for skilled PT interventions to address functional limitations, improve safety and independence with functional mobility, and return to PLOF.     PT Assessment  Patient needs continued PT services    Follow Up Recommendations  Home health PT    Does the patient have the potential to tolerate intense rehabilitation      Barriers to Discharge        Equipment Recommendations  Other (comment) (Tub bench)    Recommendations for Other Services     Frequency 7X/week    Precautions / Restrictions Precautions Precautions: Fall;Knee Precaution Comments: Educated pt and husband regarding no pillow or towel roll under knee, only under ankle to encourage knee extension Required Braces or Orthoses: Knee Immobilizer - Right Knee Immobilizer - Right: On when out of bed or walking Restrictions Weight Bearing Restrictions: Yes RLE Weight Bearing: Weight bearing as tolerated   Pertinent Vitals/Pain 7/10 at beginning of session, pt states she had pain medication prior to session and nursing was informed when she entered room      Mobility  Bed Mobility Bed Mobility: Supine to Sit;Sitting - Scoot to Edge of Bed Supine to Sit: 4: Min assist;HOB elevated;With rails Sitting - Scoot to Edge of Bed: 4: Min guard Details for Bed Mobility Assistance: Assist for movement of RLE to EOB. Pt with  heavy use of bed rails for support while transitioning to full upright posture. Transfers Transfers: Sit to Stand;Stand to [employer] Transfers Sit to Stand: 3: Mod assist;From bed;With upper extremity assist Stand to Sit: 4: Min guard;To chair/3-in-1;With upper extremity assist Stand Pivot Transfers: 4: Min assist Details for Transfer Assistance: Assist to come to full stand, and for walker placement during pivot to chair. VC's for WBAT status on right and sequencing with the RW. Ambulation/Gait Ambulation/Gait Assistance: Not tested (comment)    Exercises Total Joint Exercises Ankle Circles/Pumps: 10 reps Quad Sets: 10 reps   PT Diagnosis: Difficulty walking;Acute pain  PT Problem List: Decreased strength;Decreased range of motion;Decreased activity tolerance;Decreased balance;Decreased mobility;Decreased knowledge of use of DME;Decreased knowledge of precautions;Pain PT Treatment Interventions: DME instruction;Gait training;Stair training;Functional mobility training;Therapeutic activities;Therapeutic exercise;Neuromuscular re-education;Patient/family education     PT Goals(Current goals can be found in the care plan section) Acute Rehab PT Goals Patient Stated Goal: To return home with husband PT Goal Formulation: With patient/family Time For Goal Achievement: 11/29/12 Potential to Achieve Goals: Good  Visit Information  Last PT Received On: 11/22/12 Assistance Needed: +1 History of Present Illness: Pt is a 52 y/o female admitted s/p R TKA on 11/22/12.       Prior Functioning  Home Living Family/patient expects to be discharged to:: Private residence Living Arrangements: Spouse/significant other Available Help at Discharge: Family;Available 24 hours/day Type of Home: House Home Access: Stairs to enter [employer] of Steps: 2 Entrance Stairs-Rails: Right;Left Home Layout: Two level;Able to live on main level with bedroom/bathroom Home Equipment: Bedside  commode;Crutches;Walker -  2 wheels Prior Function Level of Independence: Needs assistance Gait / Transfers Assistance Needed: Used walker occasionally, and needed assist to get in and out of the house Communication Communication: No difficulties Dominant Hand: Right    Cognition  Cognition Arousal/Alertness: Awake/alert Behavior During Therapy: WFL for tasks assessed/performed Overall Cognitive Status: Within Functional Limits for tasks assessed    Extremity/Trunk Assessment Upper Extremity Assessment Upper Extremity Assessment: Overall WFL for tasks assessed Lower Extremity Assessment Lower Extremity Assessment: RLE deficits/detail RLE Deficits / Details: Decreased strength and AROM consistent with R TKA RLE: Unable to fully assess due to pain Cervical / Trunk Assessment Cervical / Trunk Assessment: Normal   Balance Balance Balance Assessed: Yes Static Sitting Balance Static Sitting - Balance Support: Feet supported;Bilateral upper extremity supported Static Sitting - Level of Assistance: 5: Stand by assistance Static Sitting - Comment/# of Minutes: 3 minutes at EOB Static Standing Balance Static Standing - Balance Support: Bilateral upper extremity supported Static Standing - Level of Assistance: 4: Min assist Static Standing - Comment/# of Minutes: 30 seconds  End of Session PT - End of Session Equipment Utilized During Treatment: Gait belt;Right knee immobilizer Activity Tolerance: Patient tolerated treatment well Patient left: in chair;with call bell/phone within reach;with family/visitor present CPM Right Knee CPM Right Knee: Off Right Knee Flexion (Degrees): 60 Right Knee Extension (Degrees): 0  GP     Ruthann Cancer 11/22/2012, 5:01 PM  Ruthann Cancer, PT, DPT [phone]

## 2012-11-22 NOTE — Anesthesia Postprocedure Evaluation (Signed)
Anesthesia Post Note  Patient: [name] Mcgee  Procedure(s) Performed: Procedure(s) (LRB): TOTAL KNEE ARTHROPLASTY (Right)  Anesthesia type: General  Patient location: PACU  Post pain: Pain level controlled  Post assessment: Patient's Cardiovascular Status Stable  Last Vitals:  Filed Vitals:   11/22/12 1439  BP: 117/79  Pulse: 95  Temp: 36.6 C  Resp: 18    Post vital signs: Reviewed and stable  Level of consciousness: alert  Complications: No apparent anesthesia complications

## 2012-11-22 NOTE — Progress Notes (Signed)
Placed patient on auto mode with nasal mask and 4lpm 02 bleed in.  Patient is tolerating well.

## 2012-11-22 NOTE — Anesthesia Preprocedure Evaluation (Signed)
Anesthesia Evaluation  Patient identified by MRN, date of birth, ID band Patient awake    Reviewed: Allergy & Precautions, H&P , NPO status , Patient's Chart, lab work & pertinent test results, reviewed documented beta blocker date and time   Airway Mallampati: II TM Distance: >3 FB Neck ROM: full    Dental   Pulmonary sleep apnea and Continuous Positive Airway Pressure Ventilation ,  breath sounds clear to auscultation        Cardiovascular negative cardio ROS  Rhythm:regular     Neuro/Psych  Headaches, PSYCHIATRIC DISORDERS    GI/Hepatic Neg liver ROS, hiatal hernia, GERD-  Medicated and Controlled,  Endo/Other  Hypothyroidism Morbid obesity  Renal/GU negative Renal ROS  negative genitourinary   Musculoskeletal   Abdominal   Peds  Hematology negative hematology ROS (+)   Anesthesia Other Findings See surgeon's H&P   Reproductive/Obstetrics negative OB ROS                           Anesthesia Physical Anesthesia Plan  ASA: III  Anesthesia Plan: General   Post-op Pain Management:    Induction: Intravenous  Airway Management Planned: Oral ETT  Additional Equipment:   Intra-op Plan:   Post-operative Plan: Extubation in OR  Informed Consent: I have reviewed the patients History and Physical, chart, labs and discussed the procedure including the risks, benefits and alternatives for the proposed anesthesia with the patient or authorized representative who has indicated his/her understanding and acceptance.   Dental Advisory Given  Plan Discussed with: CRNA and Surgeon  Anesthesia Plan Comments:         Anesthesia Quick Evaluation

## 2012-11-22 NOTE — Progress Notes (Signed)
Placed patient on auto max 20 min 7 with 5lpm 02 bleed in and nasal pillows that she brought from home.  Patient is tolerating well.

## 2012-11-22 NOTE — Op Note (Signed)
MRN:     [id number] DOB/AGE:    51-05-1960 / 51 y.o.       OPERATIVE REPORT    DATE OF PROCEDURE:  11/22/2012       PREOPERATIVE DIAGNOSIS:   right knee DJD      There is no weight on file to calculate BMI.                                                        POSTOPERATIVE DIAGNOSIS:   right knee degenerative joint disease                                                                      PROCEDURE:  Procedure(s): TOTAL KNEE ARTHROPLASTY Using Depuy Sigma RP implants #3 Femur, #3Tibia, 10mm sigma RP bearing, 32 Patella     SURGEON: Shareta Fishbaugh A    ASSISTANT:  Kirstin Shepperson PA-C   (Present and scrubbed throughout the case, critical for assistance with exposure, retraction, instrumentation, and closure.)         ANESTHESIA: GET with Femoral Nerve Block  DRAINS: foley, 2 medium hemovac in knee   TOURNIQUET TIME:   COMPLICATIONS:  None     SPECIMENS: None   INDICATIONS FOR PROCEDURE: The patient has  right knee DJD, varus deformities, XR shows bone on bone arthritis. Patient has failed all conservative measures including anti-inflammatory medicines, narcotics, attempts at  exercise and weight loss, cortisone injections and viscosupplementation.  Risks and benefits of surgery have been discussed, questions answered.   DESCRIPTION OF PROCEDURE: The patient identified by armband, received  right femoral nerve block and IV antibiotics, in the holding area at [facility]. Patient taken to the operating room, appropriate anesthetic  monitors were attached General endotracheal anesthesia induced with  the patient in supine position, Foley catheter was inserted. Tourniquet  applied high to the operative thigh. Lateral post and foot positioner  applied to the table, the lower extremity was then prepped and draped  in usual sterile fashion from the ankle to the tourniquet. Time-out procedure was performed. The limb was wrapped with an Esmarch bandage and the tourniquet  inflated to 365 mmHg. We began the operation by making the anterior midline incision starting at handbreadth above the patella going over the patella 1 cm medial to and  4 cm distal to the tibial tubercle. Small bleeders in the skin and the  subcutaneous tissue identified and cauterized. Transverse retinaculum was incised and reflected medially and a medial parapatellar arthrotomy was accomplished. the patella was everted and theprepatellar fat pad resected. The superficial medial collateral  ligament was then elevated from anterior to posterior along the proximal  flare of the tibia and anterior half of the menisci resected. The knee was hyperflexed exposing bone on bone arthritis. Peripheral and notch osteophytes as well as the cruciate ligaments were then resected. We continued to  work our way around posteriorly along the proximal tibia, and externally  rotated the tibia subluxing it out from underneath the femur. A McHale  retractor was placed through the notch and a lateral  Hohmann retractor  placed, and we then drilled through the proximal tibia in line with the  axis of the tibia followed by an intramedullary guide rod and 2-degree  posterior slope cutting guide. The tibial cutting guide was pinned into place  allowing resection of 4 mm of bone medially and about 6 mm of bone  laterally because of her varus deformity. Satisfied with the tibial resection, we then  entered the distal femur 2 mm anterior to the PCL origin with the  intramedullary guide rod and applied the distal femoral cutting guide  set at 11mm, with 5 degrees of valgus. This was pinned along the  epicondylar axis. At this point, the distal femoral cut was accomplished without difficulty. We then sized for a #3 femoral component and pinned the guide in 3 degrees of external rotation.The chamfer cutting guide was pinned into place. The anterior, posterior, and chamfer cuts were accomplished without difficulty followed by  the  Sigma RP box cutting guide and the box cut. We also removed posterior osteophytes from the posterior femoral condyles. At this  time, the knee was brought into full extension. We checked our  extension and flexion gaps and found them symmetric at 10mm.  The patella thickness measured at 21 mm. We set the cutting guide at 12 and removed the posterior 9.5-10 mm  of the patella sized for 32 button and drilled the lollipop. The knee  was then once again hyperflexed exposing the proximal tibia. We sized for a #3 tibial base plate, applied the smokestack and the conical reamer followed by the the Delta fin keel punch. We then hammered into place the Sigma RP trial femoral component, inserted a 10-mm trial bearing, trial patellar button, and took the knee through range of motion from 0-130 degrees. No thumb pressure was required for patellar  tracking. At this point, all trial components were removed, a double batch of DePuy HV cement  was mixed and applied to all bony metallic mating surfaces except for the posterior condyles of the femur itself. In order, we  hammered into place the tibial tray and removed excess cement, the femoral component and removed excess cement, a 10-mm Sigma RP bearing  was inserted, and the knee brought to full extension with compression.  The patellar button was clamped into place, and excess cement  removed. While the cement cured the wound was irrigated out with normal saline solution pulse lavage, and medium Hemovac drains were placed.. Ligament stability and patellar tracking were checked and found to be excellent. The tourniquet was then released and hemostasis was obtained with cautery. The parapatellar arthrotomy was closed with  #1 ethibond suture. The subcutaneous tissue with 0 and 2-0 undyed  Vicryl suture, and 4-0 Monocryl.. A dressing of Xeroform,  4 x 4, dressing sponges, Webril, and Ace wrap applied. Needle and sponge count were correct times 2.The patient awakened,  extubated, and taken to recovery room without difficulty. Vascular status was normal, pulses 2+ and symmetric.   Jesilyn Easom A 11/22/2012, 12:24 PM

## 2012-11-22 NOTE — H&P (View-Only) (Signed)
TOTAL KNEE ADMISSION H&P  Patient is being admitted for right total knee arthroplasty.  Subjective:  Chief Complaint:right knee pain.  HPI: [name] Mcgee, 52 y.o. female, has a history of pain and functional disability in the right knee due to arthritis and has failed non-surgical conservative treatments for greater than 12 weeks to includeNSAID's and/or analgesics, corticosteriod injections, viscosupplementation injections, flexibility and strengthening excercises, use of assistive devices, weight reduction as appropriate and activity modification.  Onset of symptoms was gradual, starting >10 years ago with gradually worsening course since that time. The patient noted prior procedures on the knee to include  arthroscopy and menisectomy on the right knee(s).  Patient currently rates pain in the right knee(s) at 10 out of 10 with activity. Patient has night pain, worsening of pain with activity and weight bearing, pain that interferes with activities of daily living, crepitus and joint swelling.  Patient has evidence of subchondral sclerosis, periarticular osteophytes and joint space narrowing by imaging studies.  There is no active infection.  Patient Active Problem List   Diagnosis Date Noted  . Back pain   . Right knee DJD   . Thyroid disease   . Hepatitis C   . GERD (gastroesophageal reflux disease)   . Anxiety   . Sleep apnea, obstructive    Past Medical History  Diagnosis Date  . Back pain   . Right knee DJD   . Thyroid disease   . Depression   . Hepatitis C   . GERD (gastroesophageal reflux disease)   . Anxiety   . Sleep apnea, obstructive     wears a CPAP at night    Past Surgical History  Procedure Laterality Date  . Cholecystectomy  2005  . Radical hysterectomy  1999  . Knee arthroscopy Right 1999  . Knee arthroscopy Right   . Knee arthroscopy Right 2010  . Knee arthroscopy Left   . Tonsilectomy/adenoidectomy with myringotomy  1975     (Not in a hospital  admission) No Known Allergies    Current outpatient prescriptions:ALPRAZolam (XANAX) 1 MG tablet, Take 1 mg by mouth 3 (three) times daily., Disp: , Rfl: ;  amitriptyline (ELAVIL) 10 MG tablet, Take 20 mg by mouth at bedtime., Disp: , Rfl: ;  ARIPiprazole (ABILIFY) 15 MG tablet, Take 15 mg by mouth daily., Disp: , Rfl: ;  DULoxetine (CYMBALTA) 60 MG capsule, Take 60 mg by mouth 2 (two) times daily., Disp: , Rfl:  gabapentin (NEURONTIN) 800 MG tablet, Take 800 mg by mouth 4 (four) times daily - after meals and at bedtime., Disp: , Rfl: ;  HYDROcodone-acetaminophen (NORCO/VICODIN) 5-325 MG per tablet, Take 1 tablet by mouth every 6 (six) hours as needed for moderate pain., Disp: , Rfl: ;  lansoprazole (PREVACID) 30 MG capsule, Take 30 mg by mouth daily at 12 noon., Disp: , Rfl:  levothyroxine (SYNTHROID, LEVOTHROID) 112 MCG tablet, Take 112 mcg by mouth daily before breakfast., Disp: , Rfl:   History  Substance Use Topics  . Smoking status: Never Smoker   . Smokeless tobacco: Not on file  . Alcohol Use: Yes     Comment: social    Family History  Problem Relation Age of Onset  . Heart disease Father   . Heart attack Father   . Hypertension Father   . Diabetes Father   . Kidney disease Father   . Heart attack Maternal Grandfather      Review of Systems  Constitutional: Negative.   HENT: Negative.   Eyes:  Negative.   Respiratory: Negative.   Cardiovascular: Negative.   Genitourinary: Negative.   Musculoskeletal: Positive for back pain and joint pain.  Skin: Negative.   Neurological: Negative.   Endo/Heme/Allergies: Negative.   Psychiatric/Behavioral: Positive for depression. Negative for suicidal ideas, hallucinations, memory loss and substance abuse. The patient is nervous/anxious. The patient does not have insomnia.     Objective:  Physical Exam  Constitutional: She is oriented to person, place, and time. She appears well-developed and well-nourished.  HENT:  Head:  Normocephalic and atraumatic.  Mouth/Throat: Oropharynx is clear and moist.  Eyes: Conjunctivae are normal. Pupils are equal, round, and reactive to light.  Neck: Normal range of motion. Neck supple.  Cardiovascular: Normal rate, regular rhythm and normal heart sounds.   Respiratory: Effort normal and breath sounds normal.  GI: Soft. Bowel sounds are normal.  Genitourinary:  Not pertinent to current symptomatology therefore not examined.  Musculoskeletal:  She ambulates with a moderate antalgic gait. She has active range of motion -5-110 degrees bilaterally 1+ crepitus bilaterally 1+ synovitis bilaterally medial joint line tenderness bilaterally normal patella tracking bilaterally 2+dorsalis pedis pulses bilaterally.  Neurological: She is alert and oriented to person, place, and time.  Skin: Skin is warm and dry.  Psychiatric: She has a normal mood and affect. Her behavior is normal.    Vital signs in last 24 hours: Last recorded: 11/05 1300   BP: 139/97 Pulse: 104  Temp: 97.8 F (36.6 C)    Height: 5\' 3"  (1.6 m) SpO2: 98  Weight: 121.564 kg (268 lb)     Labs:   Estimated body mass index is 47.49 kg/(m^2) as calculated from the following:   Height as of this encounter: 5\' 3"  (1.6 m).   Weight as of this encounter: 121.564 kg (268 lb).   Imaging Review Plain radiographs demonstrate severe degenerative joint disease of the right knee(s). The overall alignment ismild varus. The bone quality appears to be good for age and reported activity level.  Assessment/Plan:  End stage arthritis, right knee   The patient history, physical examination, clinical judgment of the provider and imaging studies are consistent with end stage degenerative joint disease of the right knee(s) and total knee arthroplasty is deemed medically necessary. The treatment options including medical management, injection therapy arthroscopy and arthroplasty were discussed at length. The risks and benefits of  total knee arthroplasty were presented and reviewed. The risks due to aseptic loosening, infection, stiffness, patella tracking problems, thromboembolic complications and other imponderables were discussed. The patient acknowledged the explanation, agreed to proceed with the plan and consent was signed. Patient is being admitted for inpatient treatment for surgery, pain control, PT, OT, prophylactic antibiotics, VTE prophylaxis, progressive ambulation and ADL's and discharge planning. The patient is planning to be discharged home with home health services  Emilyann Banka A. Gwinda Passe Physician Assistant [facility] [phone]  11/10/2012, 2:18 PM

## 2012-11-22 NOTE — Interval H&P Note (Signed)
History and Physical Interval Note:  11/22/2012 10:33 AM  [name] Mcgee  has presented today for surgery, with the diagnosis of right knee DJD  The various methods of treatment have been discussed with the patient and family. After consideration of risks, benefits and other options for treatment, the patient has consented to  Procedure(s): TOTAL KNEE ARTHROPLASTY (Right) as a surgical intervention .  The patient's history has been reviewed, patient examined, no change in status, stable for surgery.  I have reviewed the patient's chart and labs.  Questions were answered to the patient's satisfaction.     Salvatore Marvel A

## 2012-11-22 NOTE — Progress Notes (Signed)
Orthopedic Tech Progress Note Patient Details:  [name] Mcgee 08/17/1960 [id number]  Patient ID: Otila Back, female   DOB: Jun 07, 1960, 52 y.o.   MRN: [id number] Placed pt's rle in cpm @0 -60 degrees @1745   Nikki Dom 11/22/2012, 7:51 PM

## 2012-11-22 NOTE — Preoperative (Signed)
Beta Blockers   Reason not to administer Beta Blockers:Not Applicable 

## 2012-11-23 LAB — BASIC METABOLIC PANEL
BUN: 12 mg/dL (ref 6–23)
Calcium: 8.8 mg/dL (ref 8.4–10.5)
Chloride: 98 mEq/L (ref 96–112)
Creatinine, Ser: 0.96 mg/dL (ref 0.50–1.10)
GFR calc Af Amer: 78 mL/min — ABNORMAL LOW (ref >90)
GFR calc non Af Amer: 67 mL/min — ABNORMAL LOW (ref >90)

## 2012-11-23 LAB — CBC
HCT: 30.4 % — ABNORMAL LOW (ref 36.0–46.0)
Hemoglobin: 10 g/dL — ABNORMAL LOW (ref 12.0–15.0)
MCH: 27.9 pg (ref 26.0–34.0)
MCHC: 32.9 g/dL (ref 30.0–36.0)
MCV: 84.9 fL (ref 78.0–100.0)
RBC: 3.58 MIL/uL — ABNORMAL LOW (ref 3.87–5.11)
RDW: 14.5 % (ref 11.5–15.5)
WBC: 9.8 10*3/uL (ref 4.0–10.5)

## 2012-11-23 LAB — GLUCOSE, CAPILLARY
Glucose-Capillary: 186 mg/dL — ABNORMAL HIGH (ref 70–99)
Glucose-Capillary: 184 mg/dL — ABNORMAL HIGH (ref 70–99)

## 2012-11-23 MED ORDER — OXYCODONE HCL ER 20 MG PO T12A: EXTENDED_RELEASE_TABLET | ORAL | Status: DC

## 2012-11-23 MED ORDER — CELECOXIB 200 MG PO CAPS: ORAL_CAPSULE | ORAL | Status: AC

## 2012-11-23 MED ORDER — DSS 100 MG PO CAPS: ORAL_CAPSULE | ORAL | Status: AC

## 2012-11-23 MED ORDER — ASPIRIN 325 MG PO TBEC: DELAYED_RELEASE_TABLET | ORAL | Status: AC

## 2012-11-23 MED ORDER — BISACODYL 5 MG PO TBEC: DELAYED_RELEASE_TABLET | ORAL | Status: AC

## 2012-11-23 MED ORDER — ACETAMINOPHEN 325 MG PO TABS: 650.0000 mg | ORAL_TABLET | Freq: Four times a day (QID) | ORAL | Status: DC | PRN

## 2012-11-23 MED ORDER — OXYCODONE HCL 5 MG PO TABS: ORAL_TABLET | ORAL | Status: DC

## 2012-11-23 NOTE — Progress Notes (Signed)
11/23/12 Spoke with patient and her husband about HHC, they selected [facility]. Contacted [facility], spoke with [facility] and set up HHPT with service to start 11/24/12. Faxed order, facesheet, H and P, op note, PT note and d/c summary to [phone] and received receipt confirmation. Patient states that [employer] delivered a CPM, rolling walker and 3N1 to her home. No other discharge needs identified. Jacquelynn Cree RN, BSN, CCM

## 2012-11-23 NOTE — Progress Notes (Signed)
Physical Therapy Treatment Patient Details Name: [name] Mcgee MRN: [id number] DOB: Jan 27, 1960 Today's Date: 11/23/2012 Time: 5784-6962 PT Time Calculation (min): 24 min  PT Assessment / Plan / Recommendation  History of Present Illness Pt is a 52 y/o female admitted s/p R TKA on 11/22/12.   PT Comments   Pt progressing well with ambulation, transfers, and therex's.  Pt able to perform functional bathroom tasks with few cues.  Pt has good safety awareness and good family support for d/c home.  Pt will benefit from HHPT to increase functional independence at home.  Follow Up Recommendations  Home health PT     Does the patient have the potential to tolerate intense rehabilitation     Barriers to Discharge        Equipment Recommendations  None recommended by PT    Recommendations for Other Services    Frequency 7X/week   Progress towards PT Goals Progress towards PT goals: Progressing toward goals  Plan Current plan remains appropriate    Precautions / Restrictions Precautions Precautions: Fall;Knee Precaution Comments: Pt able to perform x10 SLR with <10 extensor lag; knee immobilizer deferred Required Braces or Orthoses: Knee Immobilizer - Right Knee Immobilizer - Right: On when out of bed or walking Restrictions Weight Bearing Restrictions: Yes RLE Weight Bearing: Weight bearing as tolerated   Pertinent Vitals/Pain Denied pain    Mobility  Bed Mobility Bed Mobility: Not assessed Details for Bed Mobility Assistance: pt received up in recliner. Pt reports no difficulty getting in and out of bed, and states she does not need any assistance to lift LE's into bed. Transfers Transfers: Sit to Stand;Stand to Sit Sit to Stand: 5: Supervision;From chair/3-in-1;With upper extremity assist Stand to Sit: 5: Supervision;To chair/3-in-1;With upper extremity assist Details for Transfer Assistance: No physical asssit required to come to full standing. Pt demonstrated proper hand  placement and safety awareness with the walker. Ambulation/Gait Ambulation/Gait Assistance: 5: Supervision;4: Min guard Ambulation Distance (Feet): 350 Feet Assistive device: Rolling walker Ambulation/Gait Assistance Details: Pt demonstrated good safety awarness with RW; cues to stay closer to RW with turns Gait Pattern: Step-through pattern;Decreased stride length;Right flexed knee in stance Gait velocity: Decreased    Exercises Total Joint Exercises Ankle Circles/Pumps: 10 reps;AROM;Right Quad Sets: 10 reps;AROM;Right Towel Squeeze: 10 reps Heel Slides: 10 reps;AROM;Right Hip ABduction/ADduction: AROM;Right;10 reps Straight Leg Raises: AROM;Right;10 reps Long Arc Quad: AROM;Right;10 reps Goniometric ROM: 5-91   PT Diagnosis:    PT Problem List:   PT Treatment Interventions:     PT Goals (current goals can now be found in the care plan section) Acute Rehab PT Goals Patient Stated Goal: To return home with husband PT Goal Formulation: With patient/family Time For Goal Achievement: 11/29/12 Potential to Achieve Goals: Good  Visit Information  Last PT Received On: 11/23/12 Assistance Needed: +1 History of Present Illness: Pt is a 52 y/o female admitted s/p R TKA on 11/22/12.    Subjective Data  Subjective: "I've already walked around the hallways this morning." Patient Stated Goal: To return home with husband   Cognition  Cognition Arousal/Alertness: Awake/alert Behavior During Therapy: WFL for tasks assessed/performed Overall Cognitive Status: Within Functional Limits for tasks assessed    Balance  Balance Balance Assessed: Yes Static Sitting Balance Static Sitting - Balance Support: No upper extremity supported;Feet supported Static Standing Balance Static Standing - Balance Support: No upper extremity supported;During functional activity (Grooming at sink) Static Standing - Level of Assistance: 5: Stand by assistance Static Standing - Comment/# of  Minutes: 2-3  min  End of Session PT - End of Session Equipment Utilized During Treatment: Gait belt Activity Tolerance: Patient tolerated treatment well Patient left: in chair;with call bell/phone within reach;with family/visitor present Nurse Communication: Mobility status   GP     Ernestina Columbia, SPTA                                                                                                                                                                                                                      11/23/2012, 1:35 PM

## 2012-11-23 NOTE — Evaluation (Signed)
Occupational Therapy Evaluation Patient Details Name: [name] Mcgee MRN: [id number] DOB: 12-27-1960 Today's Date: 11/23/2012 Time: 4098-1191 OT Time Calculation (min): 25 min  OT Assessment / Plan / Recommendation History of present illness Pt is a 52 y/o female admitted s/p R TKA on 11/22/12.   Clinical Impression   Pt admitted as above impacting her ability to independently perform ADL's and functional transfers. Pt plans to d/c home later today w/ assist PRN from her husband. She currently needs min assist for LB ADL's, however according to pt/husband this was her baseline level. Has DME & report no further acute OT needs at this time. Will sign off.    OT Assessment  Patient does not need any further OT services    Follow Up Recommendations  No OT follow up    Barriers to Discharge      Equipment Recommendations  None recommended by OT (Pt has DME at home)    Recommendations for Other Services    Frequency       Precautions / Restrictions Precautions Precautions: Fall;Knee Precaution Comments: Educated pt and husband regarding no pillow or towel roll under knee, only under ankle to encourage knee extension Restrictions Weight Bearing Restrictions: Yes RLE Weight Bearing: Weight bearing as tolerated   Pertinent Vitals/Pain R Knee pain, did not rate "Oh, I don't know" Pt reports that she has had pain medication this am.    ADL  Eating/Feeding: Performed;Independent Where Assessed - Eating/Feeding: Chair Grooming: Performed;Wash/dry hands;Wash/dry face;Supervision/safety (Standing at sink) Where Assessed - Grooming: Supported standing;Unsupported standing Upper Body Bathing: Simulated;Set up Where Assessed - Upper Body Bathing: Unsupported sitting;Supported sitting Lower Body Bathing: Simulated;Minimal assistance Where Assessed - Lower Body Bathing: Supported sit to stand Upper Body Dressing: Simulated;Modified independent Where Assessed - Upper Body Dressing:  Unsupported sitting;Supported sitting Lower Body Dressing: Performed;Minimal assistance Where Assessed - Lower Body Dressing: Supported sit to stand Toilet Transfer: Performed;Supervision/safety (Pt ambulated into bathroom, used 3:1 over toilet) Toilet Transfer Method: Sit to Barista: Raised toilet seat with arms (or 3-in-1 over toilet) Toileting - Clothing Manipulation and Hygiene: Performed;Supervision/safety Where Assessed - Toileting Clothing Manipulation and Hygiene: Sit on 3-in-1 or toilet;Sit to stand from 3-in-1 or toilet Tub/Shower Transfer Method: Not assessed Equipment Used: Rolling walker Transfers/Ambulation Related to ADLs: Pt is overall supervision level functional mobility and trasnfers related to ADL's.  ADL Comments: Pt & her husband were educated in role of OT as well as education for LB dress/bathe and tub transfers at home. Pt has smaller bathroom and therefore pt was educated in side stepping w/ RW vs use of 3:1 out side of bathroom initially at d/c. Due to small bathroom, tub transfers were discussed, but pt was advised to "practice" w/ home health therapist prior to attempting on thier own. Pt's husband assists w/ LB ADL's prior to this and states he will be able to assist PRN. She has DME.    OT Diagnosis:    OT Problem List:   OT Treatment Interventions:     OT Goals(Current goals can be found in the care plan section) Acute Rehab OT Goals Patient Stated Goal: To return home with husband  Visit Information  Last OT Received On: 11/23/12 Assistance Needed: +1 History of Present Illness: Pt is a 52 y/o female admitted s/p R TKA on 11/22/12.       Prior Functioning     Home Living Family/patient expects to be discharged to:: Private residence Living Arrangements: Spouse/significant other Available Help at Discharge:  Family;Available 24 hours/day Type of Home: House Home Access: Stairs to enter [employer] of Steps:  2 Entrance Stairs-Rails: Right;Left Home Layout: Two level;Able to live on main level with bedroom/bathroom Home Equipment: Bedside commode;Crutches;Walker - 2 wheels Prior Function Level of Independence: Needs assistance Gait / Transfers Assistance Needed: Used walker occasionally, and needed assist to get in and out of the house ADL's / Homemaking Assistance Needed: Assistance from husband Communication Communication: No difficulties Dominant Hand: Right    Vision/Perception Vision - History Baseline Vision: Wears glasses only for reading Patient Visual Report: No change from baseline   Cognition  Cognition Arousal/Alertness: Awake/alert Behavior During Therapy: WFL for tasks assessed/performed Overall Cognitive Status: Within Functional Limits for tasks assessed    Extremity/Trunk Assessment Upper Extremity Assessment Upper Extremity Assessment: Overall WFL for tasks assessed Lower Extremity Assessment Lower Extremity Assessment: Defer to PT evaluation Cervical / Trunk Assessment Cervical / Trunk Assessment: Normal    Mobility Bed Mobility Bed Mobility: Not assessed (Pt up in chair) Transfers Transfers: Sit to Stand;Stand to Sit Sit to Stand: 5: Supervision;From chair/3-in-1;With armrests Stand to Sit: 5: Supervision;To chair/3-in-1;With armrests Details for Transfer Assistance: Pt demonstrating good technique w/ RW and hand placement during transfers        Balance Balance Balance Assessed: Yes Static Sitting Balance Static Sitting - Balance Support: No upper extremity supported;Feet supported Static Standing Balance Static Standing - Balance Support: No upper extremity supported;During functional activity (Grooming at sink) Static Standing - Level of Assistance: 5: Stand by assistance Static Standing - Comment/# of Minutes: 2-3 min   End of Session OT - End of Session Equipment Utilized During Treatment: Rolling walker Activity Tolerance: Patient tolerated  treatment well Patient left: in chair;with call bell/phone within reach;with family/visitor present Nurse Communication: Mobility status;Other (comment) (No need for further acute OT at this time) CPM Right Knee CPM Right Knee: Off  GO     [name] 11/23/2012, 11:03 AM

## 2012-11-23 NOTE — Care Management Utilization Note (Signed)
Utilization review completed. Kysen Wetherington, RN BSN 

## 2012-11-23 NOTE — Progress Notes (Signed)
Pt d/c to home with family.  IV removed, prescriptions given and instructions reviewed.  HHPT set up.

## 2012-11-23 NOTE — Discharge Summary (Signed)
Patient ID: [name] Mcgee MRN: [id number] DOB/AGE: [birth year] 52 y.o.  Admit date: 11/22/2012 Discharge date: 11/23/2012  Admission Diagnoses:  Principal Problem:   Right knee DJD Active Problems:   Back pain   Thyroid disease   Hepatitis C   GERD (gastroesophageal reflux disease)   Anxiety   Sleep apnea, obstructive   Discharge Diagnoses:  Same  Past Medical History  Diagnosis Date  . Back pain   . Right knee DJD   . Thyroid disease   . Depression   . Hepatitis C   . GERD (gastroesophageal reflux disease)   . Anxiety   . Hypothyroidism   . Sleep apnea, obstructive     wears a CPAP at night started this yr  . History of kidney stones   . H/O hiatal hernia   . Headache(784.0)     hx migraines    Surgeries: Procedure(s): TOTAL KNEE ARTHROPLASTY on 11/22/2012   Consultants:    Discharged Condition: Improved  Hospital Course: [name] Mcgee is an 52 y.o. female who was admitted 11/22/2012 for operative treatment ofRight knee DJD. Patient has severe unremitting pain that affects sleep, daily activities, and work/hobbies. After pre-op clearance the patient was taken to the operating room on 11/22/2012 and underwent  Procedure(s): TOTAL KNEE ARTHROPLASTY.    Patient was given perioperative antibiotics: Anti-infectives   Start     Dose/Rate Route Frequency Ordered Stop   11/22/12 1600  ceFAZolin (ANCEF) IVPB 2 g/50 mL premix     2 g 100 mL/hr over 30 Minutes Intravenous Every 6 hours 11/22/12 1442 11/22/12 2222   11/21/12 1055  ceFAZolin (ANCEF) 3 g in dextrose 5 % 50 mL IVPB  Status:  Discontinued     3 g 160 mL/hr over 30 Minutes Intravenous On call to O.R. 11/21/12 1055 11/22/12 1442       Patient was given sequential compression devices, early ambulation, and chemoprophylaxis to prevent DVT.  Patient benefited maximally from hospital stay and there were no complications.    Recent vital signs: Patient Vitals for the past 24 hrs:  BP Temp Temp src Pulse  Resp SpO2  11/23/12 0658 128/79 mmHg 98.4 F (36.9 C) Oral 89 18 97 %  11/22/12 2037 118/66 mmHg 97.6 F (36.4 C) - 105 18 94 %  11/22/12 1730 137/93 mmHg 98 F (36.7 C) Oral 102 - 96 %  11/22/12 1636 - - - - 18 -  11/22/12 1439 117/79 mmHg 97.9 F (36.6 C) - 95 18 97 %  11/22/12 1414 - 97.5 F (36.4 C) - - - -  11/22/12 1359 146/87 mmHg - - 100 18 97 %  11/22/12 1344 130/78 mmHg - - 99 19 96 %  11/22/12 1329 131/68 mmHg - - 94 20 100 %  11/22/12 1315 112/59 mmHg 97.5 F (36.4 C) - 97 - 90 %  11/22/12 1017 - - - 99 17 98 %  11/22/12 1016 - - - 97 18 98 %  11/22/12 1015 - - - 98 21 99 %  11/22/12 1014 - - - 102 24 98 %  11/22/12 1013 114/65 mmHg - - 99 19 98 %  11/22/12 1011 - - - 99 20 97 %  11/22/12 1010 - - - 100 20 99 %  11/22/12 1009 109/66 mmHg - - 102 18 99 %  11/22/12 1008 - - - 100 22 99 %  11/22/12 1007 - - - 96 18 100 %  11/22/12 1006 - - - 95 24 99 %  11/22/12 1005 110/53 mmHg - - 92 20 100 %  11/22/12 1000 117/75 mmHg - - 85 18 97 %  11/22/12 0955 109/65 mmHg - - 87 14 98 %  11/22/12 0950 114/76 mmHg - - 88 15 97 %  11/22/12 0945 106/73 mmHg - - 84 20 100 %  11/22/12 0940 96/80 mmHg - - 90 22 94 %     Recent laboratory studies:  Recent Labs  11/23/12 0704  WBC 9.8  HGB 10.0*  HCT 30.4*  PLT 252  NA 132*  K 4.9  CL 98  CO2 22  BUN 12  CREATININE 0.96  GLUCOSE 198*  CALCIUM 8.8     Discharge Medications:     Medication List    STOP taking these medications       COCONUT OIL PO     HYDROcodone-acetaminophen 5-325 MG per tablet  Commonly known as:  NORCO/VICODIN      TAKE these medications       acetaminophen 325 MG tablet  Commonly known as:  TYLENOL  Take 2 tablets (650 mg total) by mouth every 6 (six) hours as needed for mild pain (or Fever >/= 101).     ALPRAZolam 1 MG tablet  Commonly known as:  XANAX  Take 1 mg by mouth 3 (three) times daily.     amitriptyline 50 MG tablet  Commonly known as:  ELAVIL  Take 100 mg by mouth at  bedtime.     ARIPiprazole 15 MG tablet  Commonly known as:  ABILIFY  Take 15 mg by mouth daily.     aspirin 325 MG EC tablet  1 tab a day for the next 30 days to prevent blood clots     bisacodyl 5 MG EC tablet  Commonly known as:  bisacodyl  Take 2 tablets every night with dinner until bowel movement.  LAXITIVE.  Restart if two days since last bowel movement     celecoxib 200 MG capsule  Commonly known as:  CELEBREX  1 tab po q day with food for pain and  swelling     DSS 100 MG Caps  1 tab 2 times a day while on narcotics.  STOOL SOFTENER     DULoxetine 60 MG capsule  Commonly known as:  CYMBALTA  Take 60 mg by mouth 2 (two) times daily.     gabapentin 800 MG tablet  Commonly known as:  NEURONTIN  Take 800 mg by mouth 4 (four) times daily - after meals and at bedtime.     lansoprazole 30 MG capsule  Commonly known as:  PREVACID  Take 30 mg by mouth daily at 12 noon.     levothyroxine 112 MCG tablet  Commonly known as:  SYNTHROID, LEVOTHROID  Take 112 mcg by mouth daily before breakfast.     oxyCODONE 5 MG immediate release tablet  Commonly known as:  Oxy IR/ROXICODONE  1-2 tablets every 4-6 hrs as needed for pain.  SHORT ACTING PAIN MEDS     OxyCODONE 20 mg T12a 12 hr tablet  Commonly known as:  OXYCONTIN  1 tablet every 12 hours.  LONG ACTING PAIN MEDICATION     VITAMIN D PO  Take 1 tablet by mouth daily.        Diagnostic Studies: Dg Chest 2 View  11/16/2012   CLINICAL DATA:  Pre admit knee surgery  EXAM: CHEST  2 VIEW  COMPARISON:  None.  FINDINGS: Heart size and vascularity are normal. Lungs are clear  without infiltrate effusion or mass.  IMPRESSION: No active cardiopulmonary disease.   Electronically Signed   By: Marlan Palau M.D.   On: 11/16/2012 14:19    Disposition: Final discharge disposition not confirmed      Discharge Orders   Future Orders Complete By Expires   Call MD / Call 911  As directed    Comments:     If you experience chest pain  or shortness of breath, CALL 911 and be transported to the hospital emergency room.  If you develope a fever above 101 F, pus (white drainage) or increased drainage or redness at the wound, or calf pain, call your surgeon's office.   Change dressing  As directed    Comments:     Change the dressing daily with sterile 4 x 4 inch gauze dressing and apply TED hose.  You may clean the incision with alcohol prior to redressing.   Constipation Prevention  As directed    Comments:     Drink plenty of fluids.  Prune juice may be helpful.  You may use a stool softener, such as Colace (over the counter) 100 mg twice a day.  Use MiraLax (over the counter) for constipation as needed.   CPM  As directed    Comments:     Continuous passive motion machine (CPM):      Use the CPM from 0 to 90 for 6 hours per day.       You may break it up into 2 or 3 sessions per day.      Use CPM for 2 weeks or until you are told to stop.   Diet - low sodium heart healthy  As directed    Discharge instructions  As directed    Comments:     Total Knee Replacement Care After Refer to this sheet in the next few weeks. These discharge instructions provide you with general information on caring for yourself after you leave the hospital. Your caregiver may also give you specific instructions. Your treatment has been planned according to the most current medical practices available, but unavoidable complications sometimes occur. If you have any problems or questions after discharge, please call your caregiver. Regaining a near full range of motion of your knee within the first 3 to 6 weeks after surgery is critical. HOME CARE INSTRUCTIONS  You may resume a normal diet and activities as directed.  Perform exercises as directed.  Place gray foam block, curve side up under heel at all times except when in CPM or when walking.  DO NOT modify, tear, cut, or change in any way the gray foam block. You will receive physical therapy daily   Take showers instead of baths until informed otherwise.  You may shower on Sunday.  Please wash whole leg including wound with soap and water  Change bandages (dressings)daily It is OK to take over-the-counter tylenol in addition to the oxycodone for pain, discomfort, or fever. Oxycodone is VERY constipating.  Please take stool softener twice a day and laxatives daily until bowels are regular Eat a well-balanced diet.  Avoid lifting or driving until you are instructed otherwise.  Make an appointment to see your caregiver for stitches (suture) or staple removal as directed.  If you have been sent home with a continuous passive motion machine (CPM machine), 0-90 degrees 6 hrs a day   2 hrs a shift SEEK MEDICAL CARE IF: You have swelling of your calf or leg.  You develop shortness of  breath or chest pain.  You have redness, swelling, or increasing pain in the wound.  There is pus or any unusual drainage coming from the surgical site.  You notice a bad smell coming from the surgical site or dressing.  The surgical site breaks open after sutures or staples have been removed.  There is persistent bleeding from the suture or staple line.  You are getting worse or are not improving.  You have any other questions or concerns.  SEEK IMMEDIATE MEDICAL CARE IF:  You have a fever.  You develop a rash.  You have difficulty breathing.  You develop any reaction or side effects to medicines given.  Your knee motion is decreasing rather than improving.  MAKE SURE YOU:  Understand these instructions.  Will watch your condition.  Will get help right away if you are not doing well or get worse.   Do not put a pillow under the knee. Place it under the heel.  As directed    Comments:     Place gray foam block, curve side up under heel at all times except when in CPM or when walking.  DO NOT modify, tear, cut, or change in any way the gray foam block.   Increase activity slowly as tolerated  As directed     TED hose  As directed    Comments:     Use stockings (TED hose) for 2 weeks on both leg(s).  You may remove them at night for sleeping.      Follow-up Information   Follow up with Nilda Simmer, MD On 12/06/2012. (appt time 2:15)    Specialty:  Orthopedic Surgery   Contact information:   [address] [place] [address] [phone]        Signed: Pascal Lux 11/23/2012, 9:31 AM

## 2012-11-23 NOTE — Progress Notes (Signed)
Physical Therapy Treatment Patient Details Name: Emanie Behan MRN: [id number] DOB: 18-Jul-1960 Today's Date: 11/23/2012 Time: 1478-2956 PT Time Calculation (min): 27 min  PT Assessment / Plan / Recommendation  History of Present Illness Pt is a 52 y/o female admitted s/p R TKA on 11/22/12.   PT Comments   Pt progressing well towards physical therapy goals. Pt was able to negotiate steps today utilizing 2 hand hold on 1 hand rail. Frequent VC's required for technique during gait training, however overall pt is doing well. Did not assess bed mobility this session; pt reports she did not have any difficulty transitioning to/from EOB last night or this morning. Would like one more session with PT prior to d/c, but otherwise is safe from a mobility standpoint to go home with HHPT to follow.   Follow Up Recommendations  Home health PT     Does the patient have the potential to tolerate intense rehabilitation     Barriers to Discharge        Equipment Recommendations  None recommended by PT    Recommendations for Other Services    Frequency 7X/week   Progress towards PT Goals Progress towards PT goals: Progressing toward goals  Plan Current plan remains appropriate    Precautions / Restrictions Precautions Precautions: Fall;Knee Precaution Comments: Pt able to perform x10 SLR with <10 extensor lag; knee immobilizer deferred Restrictions Weight Bearing Restrictions: Yes RLE Weight Bearing: Weight bearing as tolerated   Pertinent Vitals/Pain 7/10 - pt received pain medicine during session.     Mobility  Bed Mobility Bed Mobility: Not assessed Details for Bed Mobility Assistance: pt received up in recliner. Pt reports no difficulty getting in and out of bed, and states she does not need any assistance to lift LE's into bed. Transfers Transfers: Sit to Stand;Stand to Sit Sit to Stand: 5: Supervision;From chair/3-in-1;With upper extremity assist Stand to Sit: 5: Supervision;To  chair/3-in-1;With upper extremity assist Details for Transfer Assistance: No physical asssit required to come to full standing. Pt demonstrated proper hand placement and safety awareness with the walker. Ambulation/Gait Ambulation/Gait Assistance: 4: Min guard Ambulation Distance (Feet): 100 Feet Assistive device: Rolling walker Ambulation/Gait Assistance Details: VC's for increased heel strike, and activation of quads during weight bearing for increased knee extension. Gait Pattern: Step-through pattern;Decreased stride length;Right flexed knee in stance Gait velocity: Decreased Stairs: Yes Stairs Assistance: 4: Min guard Stair Management Technique: One rail Right (2 hand hold) Number of Stairs: 1 (x3 ascending and descending) Wheelchair Mobility Wheelchair Mobility: No    Exercises Total Joint Exercises Ankle Circles/Pumps: 10 reps Quad Sets: 10 reps Towel Squeeze: 10 reps Heel Slides: 10 reps Hip ABduction/ADduction: 10 reps Straight Leg Raises: 10 reps Long Arc Quad: 10 reps Goniometric ROM: 5-91   PT Diagnosis:    PT Problem List:   PT Treatment Interventions:     PT Goals (current goals can now be found in the care plan section) Acute Rehab PT Goals Patient Stated Goal: To return home with husband PT Goal Formulation: With patient/family Time For Goal Achievement: 11/29/12 Potential to Achieve Goals: Good  Visit Information  Last PT Received On: 11/23/12 Assistance Needed: +1 History of Present Illness: Pt is a 52 y/o female admitted s/p R TKA on 11/22/12.    Subjective Data  Subjective: "I've already walked around the hallways this morning." Patient Stated Goal: To return home with husband   Cognition  Cognition Arousal/Alertness: Awake/alert Behavior During Therapy: WFL for tasks assessed/performed Overall Cognitive Status: Within Functional  Limits for tasks assessed    Balance  Balance Balance Assessed: Yes Static Sitting Balance Static Sitting -  Balance Support: No upper extremity supported;Feet supported Static Standing Balance Static Standing - Balance Support: No upper extremity supported;During functional activity (Grooming at sink) Static Standing - Level of Assistance: 5: Stand by assistance Static Standing - Comment/# of Minutes: 2-3 min  End of Session PT - End of Session Equipment Utilized During Treatment: Gait belt Activity Tolerance: Patient tolerated treatment well Patient left: in chair;with call bell/phone within reach;with family/visitor present Nurse Communication: Mobility status   GP     Ruthann Cancer 11/23/2012, 11:57 AM  Ruthann Cancer, PT, DPT [phone]

## 2012-11-24 ENCOUNTER — Encounter (HOSPITAL_COMMUNITY): Payer: Self-pay | Admitting: Orthopedic Surgery

## 2012-11-25 NOTE — Progress Notes (Signed)
Jania Steinke, PTA [phone] 11/25/2012  

## 2013-12-21 ENCOUNTER — Encounter (HOSPITAL_COMMUNITY): Payer: Self-pay | Admitting: Physician Assistant

## 2013-12-21 DIAGNOSIS — M1712 Unilateral primary osteoarthritis, left knee: Secondary | ICD-10-CM | POA: Diagnosis present

## 2013-12-21 NOTE — H&P (Signed)
TOTAL KNEE ADMISSION H&P  Patient is being admitted for left total knee arthroplasty.  Subjective:  Chief Complaint:left knee pain.  HPI: [name] Mcgee, 53 y.o. female, has a history of pain and functional disability in the left knee due to arthritis and has failed non-surgical conservative treatments for greater than 12 weeks to includecorticosteriod injections, viscosupplementation injections, flexibility and strengthening excercises, supervised PT with diminished ADL's post treatment, use of assistive devices, weight reduction as appropriate and activity modification.  Onset of symptoms was gradual, starting 10 years ago with gradually worsening course since that time. The patient noted prior procedures on the knee to include  arthroscopy and menisectomy on the left knee(s).  Patient currently rates pain in the left knee(s) at 10 out of 10 with activity. Patient has night pain, worsening of pain with activity and weight bearing, pain that interferes with activities of daily living, crepitus and joint swelling.  Patient has evidence of subchondral sclerosis, periarticular osteophytes and joint space narrowing by imaging studies.There is no active infection.  Patient Active Problem List   Diagnosis Date Noted  . Primary localized osteoarthritis of left knee   . Back pain   . Right knee DJD   . Thyroid disease   . Hepatitis C   . GERD (gastroesophageal reflux disease)   . Anxiety   . Sleep apnea, obstructive    Past Medical History  Diagnosis Date  . Back pain   . Right knee DJD   . Thyroid disease   . Depression   . Hepatitis C   . GERD (gastroesophageal reflux disease)   . Anxiety   . Hypothyroidism   . Sleep apnea, obstructive     wears a CPAP at night started this yr  . History of kidney stones   . H/O hiatal hernia   . Headache(784.0)     hx migraines  . Primary localized osteoarthritis of left knee     Past Surgical History  Procedure Laterality Date  . Cholecystectomy   2005  . Radical hysterectomy  1999  . Knee arthroscopy Right 1999  . Knee arthroscopy Right   . Knee arthroscopy Right 2010  . Knee arthroscopy Left   . Tonsilectomy/adenoidectomy with myringotomy  1975  . Tonsillectomy    . Abdominal hysterectomy    . Total knee arthroplasty Right 11/22/2012    Procedure: TOTAL KNEE ARTHROPLASTY;  Surgeon: Nilda Simmerobert A Wainer, MD;  [facility];  Service: Orthopedics;  Laterality: Right;    No prescriptions prior to admission   Allergies  Allergen Reactions  . Shellfish Allergy Anaphylaxis  . Lamotrigine Swelling    History  Substance Use Topics  . Smoking status: Never Smoker   . Smokeless tobacco: Not on file     Comment: social alcohol  . Alcohol Use: Yes     Comment: social    Family History  Problem Relation Age of Onset  . Heart disease Father   . Heart attack Father   . Hypertension Father   . Diabetes Father   . Kidney disease Father   . Heart attack Maternal Grandfather      Review of Systems  Constitutional: Negative.   HENT: Negative.   Eyes: Negative.   Respiratory: Negative.   Cardiovascular: Negative.   Gastrointestinal: Negative.   Genitourinary: Negative.   Musculoskeletal:       Left knee pain and back pain  Skin: Negative.   Neurological: Negative.   Endo/Heme/Allergies: Negative.     Objective:  Physical Exam  Constitutional: She is oriented to person, place, and time. She appears well-developed and well-nourished.  HENT:  Head: Normocephalic and atraumatic.  Mouth/Throat: Oropharynx is clear and moist.  Eyes: Conjunctivae and EOM are normal. Pupils are equal, round, and reactive to light.  Neck: Normal range of motion. Neck supple.  Cardiovascular: Normal rate, regular rhythm and intact distal pulses.   Respiratory: Effort normal and breath sounds normal.  GI: Soft. Bowel sounds are normal.  Genitourinary:  Not pertinent to current symptomatology therefore not examined.  Musculoskeletal:   Examination of her left knee reveals pain medially and laterally 1+ crepitation 1+ synovitis full range of motion knee is stable. Exam of her right knee reveals well healed incision without swelling or pain full range of motion knee is stable with normal patella tracking. Vascular exam: pulses 2+ and symmetric.  Neurological: She is alert and oriented to person, place, and time.  Skin: Skin is warm and dry.  Psychiatric: She has a normal mood and affect. Her behavior is normal.    Vital signs in last 24 hours: Temp:  [98.4 F (36.9 C)] 98.4 F (36.9 C) (12/16 1300) Pulse Rate:  [99] 99 (12/16 1300) BP: (151)/(94) 151/94 mmHg (12/16 1300) SpO2:  [99 %] 99 % (12/16 1300) Weight:  [117.935 kg (260 lb)] 117.935 kg (260 lb) (12/16 1300)  Labs:   Estimated body mass index is 46.07 kg/(m^2) as calculated from the following:   Height as of this encounter: 5\' 3"  (1.6 m).   Weight as of this encounter: 117.935 kg (260 lb).   Imaging Review Plain radiographs demonstrate severe degenerative joint disease of the left knee(s). The overall alignment ismild varus. The bone quality appears to be good for age and reported activity level.  Assessment/Plan:  End stage arthritis, left knee  Principal Problem:   Primary localized osteoarthritis of left knee Active Problems:   Back pain   Thyroid disease   Hepatitis C   GERD (gastroesophageal reflux disease)   Anxiety   Sleep apnea, obstructive   The patient history, physical examination, clinical judgment of the provider and imaging studies are consistent with end stage degenerative joint disease of the left knee(s) and total knee arthroplasty is deemed medically necessary. The treatment options including medical management, injection therapy arthroscopy and arthroplasty were discussed at length. The risks and benefits of total knee arthroplasty were presented and reviewed. The risks due to aseptic loosening, infection, stiffness, patella  tracking problems, thromboembolic complications and other imponderables were discussed. The patient acknowledged the explanation, agreed to proceed with the plan and consent was signed. Patient is being admitted for inpatient treatment for surgery, pain control, PT, OT, prophylactic antibiotics, VTE prophylaxis, progressive ambulation and ADL's and discharge planning. The patient is planning to be discharged home with home health services.  Home on Lovenox.  No Xarelto due to SSRI drug interaction.  No Celebrex due to history of Heptatis C.

## 2013-12-22 NOTE — Pre-Procedure Instructions (Signed)
[name] BackDeborah Mcgee  12/22/2013   Your procedure is scheduled on:  Monday, December 28th  [facility] at 645 AM.  Call this number if you have problems the morning of surgery: [phone]   Remember:   Do not eat food or drink liquids after midnight.   Take these medicines the morning of surgery with A SIP OF WATER: xanax, cymbalta, synthroid, hydrocodone if needed   Do not wear jewelry, make-up or nail polish.  Do not wear lotions, powders, or perfumes, deodorant.  Do not shave 48 hours prior to surgery. Men may shave face and neck.  Do not bring valuables to the hospital.  [facility] is not responsible  for any belongings or valuables.               Contacts, dentures or bridgework may not be worn into surgery.  Leave suitcase in the car. After surgery it may be brought to your room.  For patients admitted to the hospital, discharge time is determined by your treatment team.          Please read over the following fact sheets that you were given: Pain Booklet, Coughing and Deep Breathing, Blood Transfusion Information, MRSA Information and Surgical Site Infection Prevention  [place] - Preparing for Surgery  Before surgery, you can play an important role.  Because skin is not sterile, your skin needs to be as free of germs as possible.  You can reduce the number of germs on you skin by washing with CHG (chlorahexidine gluconate) soap before surgery.  CHG is an antiseptic cleaner which kills germs and bonds with the skin to continue killing germs even after washing.  Please DO NOT use if you have an allergy to CHG or antibacterial soaps.  If your skin becomes reddened/irritated stop using the CHG and inform your nurse when you arrive at Short Stay.  Do not shave (including legs and underarms) for at least 48 hours prior to the first CHG shower.  You may shave your face.  Please follow these instructions carefully:   1.  Shower with CHG Soap the night  before surgery and the morning of Surgery.  2.  If you choose to wash your hair, wash your hair first as usual with your normal shampoo.  3.  After you shampoo, rinse your hair and body thoroughly to remove the shampoo.  4.  Use CHG as you would any other liquid soap.  You can apply CHG directly to the skin and wash gently with scrungie or a clean washcloth.  5.  Apply the CHG Soap to your body ONLY FROM THE NECK DOWN.  Do not use on open wounds or open sores.  Avoid contact with your eyes, ears, mouth and genitals (private parts).  Wash genitals (private parts) with your normal soap.  6.  Wash thoroughly, paying special attention to the area where your surgery will be performed.  7.  Thoroughly rinse your body with warm water from the neck down.  8.  DO NOT shower/wash with your normal soap after using and rinsing off the CHG Soap.  9.  Pat yourself dry with a clean towel.            10.  Wear clean pajamas.            11.  Place clean sheets on your bed the night of your first shower and do not sleep with pets.  Day of Surgery  Do not  apply any lotions/deoderants the morning of surgery.  Please wear clean clothes to the hospital/surgery center.

## 2013-12-23 ENCOUNTER — Other Ambulatory Visit (HOSPITAL_COMMUNITY): Payer: No Typology Code available for payment source

## 2013-12-23 ENCOUNTER — Encounter (HOSPITAL_COMMUNITY)
Admission: RE | Admit: 2013-12-23 | Discharge: 2013-12-23 | Disposition: A | Discharge status: Home / Self Care | Payer: No Typology Code available for payment source | Source: Ambulatory Visit | Attending: Orthopedic Surgery | Admitting: Orthopedic Surgery

## 2013-12-23 ENCOUNTER — Encounter (HOSPITAL_COMMUNITY): Payer: Self-pay

## 2013-12-23 DIAGNOSIS — M179 Osteoarthritis of knee, unspecified: Secondary | ICD-10-CM | POA: Insufficient documentation

## 2013-12-23 DIAGNOSIS — Z01818 Encounter for other preprocedural examination: Secondary | ICD-10-CM | POA: Diagnosis not present

## 2013-12-23 HISTORY — DX: Pneumonia, unspecified organism: J18.9

## 2013-12-23 LAB — TYPE AND SCREEN
ABO/RH(D): A POS
Antibody Screen: NEGATIVE

## 2013-12-23 LAB — CBC WITH DIFFERENTIAL/PLATELET
BASOS ABS: 0 10*3/uL (ref 0.0–0.1)
Basophils Relative: 0 % (ref 0–1)
EOS PCT: 5 % (ref 0–5)
Eosinophils Absolute: 0.3 10*3/uL (ref 0.0–0.7)
HEMATOCRIT: 36.9 % (ref 36.0–46.0)
HEMOGLOBIN: 11.7 g/dL — AB (ref 12.0–15.0)
LYMPHS PCT: 28 % (ref 12–46)
Lymphs Abs: 1.6 10*3/uL (ref 0.7–4.0)
MCH: 26.8 pg (ref 26.0–34.0)
MCHC: 31.7 g/dL (ref 30.0–36.0)
MCV: 84.4 fL (ref 78.0–100.0)
Monocytes Absolute: 0.2 10*3/uL (ref 0.1–1.0)
Monocytes Relative: 4 % (ref 3–12)
NEUTROS ABS: 3.5 10*3/uL (ref 1.7–7.7)
Neutrophils Relative %: 63 % (ref 43–77)
Platelets: 308 10*3/uL (ref 150–400)
RBC: 4.37 MIL/uL (ref 3.87–5.11)
RDW: 15.3 % (ref 11.5–15.5)
WBC: 5.6 10*3/uL (ref 4.0–10.5)

## 2013-12-23 LAB — SURGICAL PCR SCREEN
MRSA, PCR: NEGATIVE
Staphylococcus aureus: NEGATIVE

## 2013-12-23 LAB — COMPREHENSIVE METABOLIC PANEL
ALT: 25 U/L (ref 0–35)
AST: 27 U/L (ref 0–37)
Albumin: 3.7 g/dL (ref 3.5–5.2)
Alkaline Phosphatase: 98 U/L (ref 39–117)
Anion gap: 13 (ref 5–15)
BUN: 11 mg/dL (ref 6–23)
CO2: 31 meq/L (ref 19–32)
CREATININE: 0.95 mg/dL (ref 0.50–1.10)
Calcium: 9.5 mg/dL (ref 8.4–10.5)
Chloride: 93 mEq/L — ABNORMAL LOW (ref 96–112)
GFR calc Af Amer: 78 mL/min — ABNORMAL LOW (ref >90)
GFR, EST NON AFRICAN AMERICAN: 68 mL/min — AB (ref >90)
Glucose, Bld: 136 mg/dL — ABNORMAL HIGH (ref 70–99)
Potassium: 3 mEq/L — ABNORMAL LOW (ref 3.7–5.3)
Sodium: 137 mEq/L (ref 137–147)
Total Bilirubin: <0.2 mg/dL — ABNORMAL LOW (ref 0.3–1.2)
Total Protein: 8.4 g/dL — ABNORMAL HIGH (ref 6.0–8.3)

## 2013-12-23 LAB — PROTIME-INR
INR: 1.02 (ref 0.00–1.49)
PROTHROMBIN TIME: 13.5 s (ref 11.6–15.2)

## 2013-12-23 LAB — APTT: aPTT: 33 seconds (ref 24–37)

## 2013-12-23 NOTE — Progress Notes (Addendum)
Nurse called and left a message informing Dr. Thurston HoleWainer that patients right calf measurement was 19 inches and the largest thigh high TED hose Short Stay has was 17.5. Staff stated they would relay the message.    X-Large Short thigh high TED hose found and placed on chart.

## 2013-12-23 NOTE — Progress Notes (Signed)
Chrissy (Designer, industrial/productlab tech) informed Nurse that lab called and stated that the specimen top was not screwed on tightly and urine wasted out in bag. Will need to recollect urine DOS. Will enter STAT urinalysis and culture for DOS.

## 2013-12-23 NOTE — Pre-Procedure Instructions (Signed)
[name] Mcgee  12/23/2013   Your procedure is scheduled on:  Monday January 02, 2014 at 8:45AM.  [facility] Admitting at 6:45 AM.  Call this number if you have problems the morning of surgery: [phone]   For any other questions M-F from 8am-4pm call: [phone]   Remember:   Do not eat food or drink liquids after midnight.   Take these medicines the morning of surgery with A SIP OF WATER: Alprazolam (Xanax), Aripiprazole (Abilify), Duloxetine (Cymbalta), Levothyroxine (Synthroid), Hydrocodone if needed   Please stop taking any herbal medications on 12/26/13 (Ex. Vitamins, Advil, Motrin, Alleve, etc)    Do not wear jewelry, make-up or nail polish.  Do not wear lotions, powders, or perfumes, deodorant.  Do not shave 48 hours prior to surgery.   Do not bring valuables to the hospital.  [facility] is not responsible  for any belongings or valuables.               Contacts, dentures or bridgework may not be worn into surgery.  Leave suitcase in the car. After surgery it may be brought to your room.  For patients admitted to the hospital, discharge time is determined by your treatment team.          Please read over the following fact sheets that you were given: Pain Booklet, Coughing and Deep Breathing, Blood Transfusion Information, MRSA Information and Surgical Site Infection Prevention  [place] - Preparing for Surgery  Before surgery, you can play an important role.  Because skin is not sterile, your skin needs to be as free of germs as possible.  You can reduce the number of germs on you skin by washing with CHG (chlorahexidine gluconate) soap before surgery.  CHG is an antiseptic cleaner which kills germs and bonds with the skin to continue killing germs even after washing.  Please DO NOT use if you have an allergy to CHG or antibacterial soaps.  If your skin becomes reddened/irritated stop using the CHG and inform your nurse when you arrive at Short  Stay.  Do not shave (including legs and underarms) for at least 48 hours prior to the first CHG shower.  You may shave your face.  Please follow these instructions carefully:   1.  Shower with CHG Soap the night before surgery and the morning of Surgery.  2.  If you choose to wash your hair, wash your hair first as usual with your normal shampoo.  3.  After you shampoo, rinse your hair and body thoroughly to remove the shampoo.  4.  Use CHG as you would any other liquid soap.  You can apply CHG directly to the skin and wash gently with scrungie or a clean washcloth.  5.  Apply the CHG Soap to your body ONLY FROM THE NECK DOWN.  Do not use on open wounds or open sores.  Avoid contact with your eyes, ears, mouth and genitals (private parts).  Wash genitals (private parts) with your normal soap.  6.  Wash thoroughly, paying special attention to the area where your surgery will be performed.  7.  Thoroughly rinse your body with warm water from the neck down.  8.  DO NOT shower/wash with your normal soap after using and rinsing off the CHG Soap.  9.  Pat yourself dry with a clean towel.            10.  Wear clean pajamas.  11.  Place clean sheets on your bed the night of your first shower and do not sleep with pets.  Day of Surgery  Do not apply any lotions/deoderants the morning of surgery.  Please wear clean clothes to the hospital/surgery center.

## 2013-12-23 NOTE — Progress Notes (Signed)
Patient denied having any cardiac or pulmonary issues. [facility]CP is [facility]. Patient instructed to bring CPAP mask DOS. Patient verbalized understanding.

## 2013-12-26 NOTE — Progress Notes (Signed)
Anesthesia Chart Review: Patient is a 53 year old female scheduled for left TKR on 01/02/14 by Dr. Thurston HoleWainer.   History includes non-smoker, Hepatitis C, GERD, hypothyroidism, anxiety, depression, hiatal hernia, nephrolithiasis, headaches, OSA with CPAP use, hysterectomy, T&A, cholecystectomy, right TKR 11/2012. BMI is consistent with morbid obesity. PCP is Dr. Lorelei PontMark Mahoney with [facility] in [place], [place].  EKG on 12/23/13 showed NSR, cannot rule out anterior infarct (age undetermined). It was not felt significantly changed from previous tracing on 11/16/12.  Preoperative labs noted. Patient provided a urine specimen at PAT, but apparently the lid was not screwed on tightly and it spilled in the bag, so another specimen will need to be done preoperatively.  (I left voice message with Sherri at Dr. Sherene SiresWainer's office regarding patient's UA/C&S specimen, so they could contact patient if they want her to provide a repeat specimen prior to the day of surgery--otherwise order is in for a UA, urine C&S on arrival.)  Velna Ochsllison Jalie Eiland, PA-C [facility] Short Stay Center/Anesthesiology Phone [phone] 12/26/2013 11:38 AM

## 2014-01-01 MED ORDER — CEFAZOLIN SODIUM-DEXTROSE 2-3 GM-% IV SOLR: 2.0000 g | INTRAVENOUS | Status: DC

## 2014-01-01 MED ORDER — CHLORHEXIDINE GLUCONATE 4 % EX LIQD
60.0000 mL | Freq: Once | CUTANEOUS | Status: DC
  Filled 2014-01-01: qty 60

## 2014-01-01 MED ORDER — POVIDONE-IODINE 7.5 % EX SOLN
Freq: Once | CUTANEOUS | Status: DC
  Filled 2014-01-01: qty 118

## 2014-01-01 MED ORDER — LACTATED RINGERS IV SOLN: INTRAVENOUS | Status: DC

## 2014-01-01 MED ORDER — DEXTROSE 5 % IV SOLN
3.0000 g | INTRAVENOUS | Status: AC
  Administered 2014-01-02: 3 g via INTRAVENOUS
  Filled 2014-01-01: qty 3000

## 2014-01-02 ENCOUNTER — Encounter (HOSPITAL_COMMUNITY)
Admission: RE | Disposition: A | Discharge status: Home / Self Care | Payer: Self-pay | Source: Ambulatory Visit | Attending: Orthopedic Surgery

## 2014-01-02 ENCOUNTER — Inpatient Hospital Stay (HOSPITAL_COMMUNITY)
Admission: RE | Admit: 2014-01-02 | Discharge: 2014-01-03 | DRG: 554 | Disposition: A | Discharge status: Home / Self Care | Payer: No Typology Code available for payment source | Source: Ambulatory Visit | Attending: Orthopedic Surgery | Admitting: Orthopedic Surgery

## 2014-01-02 ENCOUNTER — Inpatient Hospital Stay (HOSPITAL_COMMUNITY): Payer: No Typology Code available for payment source | Admitting: Anesthesiology

## 2014-01-02 ENCOUNTER — Encounter (HOSPITAL_COMMUNITY): Payer: Self-pay | Admitting: Critical Care Medicine

## 2014-01-02 ENCOUNTER — Inpatient Hospital Stay (HOSPITAL_COMMUNITY): Payer: No Typology Code available for payment source | Admitting: Vascular Surgery

## 2014-01-02 DIAGNOSIS — G4733 Obstructive sleep apnea (adult) (pediatric): Secondary | ICD-10-CM | POA: Diagnosis present

## 2014-01-02 DIAGNOSIS — F329 Major depressive disorder, single episode, unspecified: Secondary | ICD-10-CM | POA: Diagnosis present

## 2014-01-02 DIAGNOSIS — F419 Anxiety disorder, unspecified: Secondary | ICD-10-CM | POA: Diagnosis present

## 2014-01-02 DIAGNOSIS — M25562 Pain in left knee: Secondary | ICD-10-CM | POA: Diagnosis present

## 2014-01-02 DIAGNOSIS — M179 Osteoarthritis of knee, unspecified: Secondary | ICD-10-CM | POA: Diagnosis present

## 2014-01-02 DIAGNOSIS — K219 Gastro-esophageal reflux disease without esophagitis: Secondary | ICD-10-CM | POA: Diagnosis present

## 2014-01-02 DIAGNOSIS — Z91013 Allergy to seafood: Secondary | ICD-10-CM | POA: Diagnosis not present

## 2014-01-02 DIAGNOSIS — M1712 Unilateral primary osteoarthritis, left knee: Secondary | ICD-10-CM | POA: Diagnosis present

## 2014-01-02 DIAGNOSIS — Z9049 Acquired absence of other specified parts of digestive tract: Secondary | ICD-10-CM | POA: Diagnosis present

## 2014-01-02 DIAGNOSIS — Z90710 Acquired absence of both cervix and uterus: Secondary | ICD-10-CM

## 2014-01-02 DIAGNOSIS — E079 Disorder of thyroid, unspecified: Secondary | ICD-10-CM | POA: Diagnosis present

## 2014-01-02 DIAGNOSIS — Z6841 Body Mass Index (BMI) 40.0 and over, adult: Secondary | ICD-10-CM | POA: Diagnosis not present

## 2014-01-02 DIAGNOSIS — M549 Dorsalgia, unspecified: Secondary | ICD-10-CM | POA: Diagnosis present

## 2014-01-02 DIAGNOSIS — Z87442 Personal history of urinary calculi: Secondary | ICD-10-CM

## 2014-01-02 DIAGNOSIS — M1710 Unilateral primary osteoarthritis, unspecified knee: Secondary | ICD-10-CM | POA: Diagnosis present

## 2014-01-02 DIAGNOSIS — E039 Hypothyroidism, unspecified: Secondary | ICD-10-CM | POA: Diagnosis present

## 2014-01-02 DIAGNOSIS — B1920 Unspecified viral hepatitis C without hepatic coma: Secondary | ICD-10-CM | POA: Diagnosis present

## 2014-01-02 HISTORY — DX: Unilateral primary osteoarthritis, left knee: M17.12

## 2014-01-02 HISTORY — PX: TOTAL KNEE ARTHROPLASTY: SHX125

## 2014-01-02 LAB — POCT I-STAT 4, (NA,K, GLUC, HGB,HCT)
Glucose, Bld: 117 mg/dL — ABNORMAL HIGH (ref 70–99)
HCT: 36 % (ref 36.0–46.0)
Hemoglobin: 12.2 g/dL (ref 12.0–15.0)
Potassium: 3.1 mmol/L — ABNORMAL LOW (ref 3.5–5.1)
SODIUM: 139 mmol/L (ref 135–145)

## 2014-01-02 SURGERY — ARTHROPLASTY, KNEE, TOTAL
Anesthesia: General | Site: Knee | Laterality: Left

## 2014-01-02 MED ORDER — DOCUSATE SODIUM 100 MG PO CAPS
100.0000 mg | ORAL_CAPSULE | Freq: Two times a day (BID) | ORAL | Status: DC
  Administered 2014-01-02 – 2014-01-03 (×2): 100 mg via ORAL
  Filled 2014-01-02 (×3): qty 1

## 2014-01-02 MED ORDER — DEXAMETHASONE SODIUM PHOSPHATE 10 MG/ML IJ SOLN
10.0000 mg | Freq: Three times a day (TID) | INTRAMUSCULAR | Status: DC
  Administered 2014-01-02 – 2014-01-03 (×3): 10 mg via INTRAVENOUS
  Filled 2014-01-02 (×5): qty 1

## 2014-01-02 MED ORDER — FENTANYL CITRATE 0.05 MG/ML IJ SOLN
INTRAMUSCULAR | Status: DC | PRN
  Administered 2014-01-02 (×2): 25 ug via INTRAVENOUS

## 2014-01-02 MED ORDER — SODIUM CHLORIDE 0.9 % IJ SOLN
INTRAMUSCULAR | Status: AC
  Filled 2014-01-02: qty 10

## 2014-01-02 MED ORDER — MIDAZOLAM HCL 2 MG/2ML IJ SOLN
1.0000 mg | INTRAMUSCULAR | Status: DC | PRN
  Administered 2014-01-02: 1 mg via INTRAVENOUS
  Filled 2014-01-02: qty 2

## 2014-01-02 MED ORDER — LEVOTHYROXINE SODIUM 150 MCG PO TABS
150.0000 ug | ORAL_TABLET | Freq: Every day | ORAL | Status: DC
  Administered 2014-01-03: 150 ug via ORAL
  Filled 2014-01-02 (×2): qty 1

## 2014-01-02 MED ORDER — DIPHENHYDRAMINE HCL 12.5 MG/5ML PO ELIX: 12.5000 mg | ORAL_SOLUTION | ORAL | Status: DC | PRN

## 2014-01-02 MED ORDER — BUPIVACAINE-EPINEPHRINE 0.25% -1:200000 IJ SOLN
INTRAMUSCULAR | Status: DC | PRN
  Administered 2014-01-02: 30 mL

## 2014-01-02 MED ORDER — FENTANYL CITRATE 0.05 MG/ML IJ SOLN
50.0000 ug | Freq: Once | INTRAMUSCULAR | Status: AC
  Administered 2014-01-02: 50 ug via INTRAVENOUS

## 2014-01-02 MED ORDER — MILNACIPRAN HCL 50 MG PO TABS
100.0000 mg | ORAL_TABLET | Freq: Two times a day (BID) | ORAL | Status: DC
  Administered 2014-01-02 – 2014-01-03 (×2): 100 mg via ORAL
  Filled 2014-01-02 (×3): qty 2

## 2014-01-02 MED ORDER — LACTATED RINGERS IV SOLN
INTRAVENOUS | Status: DC | PRN
  Administered 2014-01-02: 08:00:00 via INTRAVENOUS

## 2014-01-02 MED ORDER — ONDANSETRON HCL 4 MG/2ML IJ SOLN
INTRAMUSCULAR | Status: DC | PRN
  Administered 2014-01-02: 4 mg via INTRAVENOUS

## 2014-01-02 MED ORDER — BACLOFEN 10 MG PO TABS
10.0000 mg | ORAL_TABLET | Freq: Two times a day (BID) | ORAL | Status: DC
  Administered 2014-01-02 – 2014-01-03 (×2): 10 mg via ORAL
  Filled 2014-01-02 (×3): qty 1

## 2014-01-02 MED ORDER — VITAMIN D3 25 MCG (1000 UNIT) PO TABS
1000.0000 [IU] | ORAL_TABLET | Freq: Every day | ORAL | Status: DC
  Administered 2014-01-02 – 2014-01-03 (×2): 1000 [IU] via ORAL
  Filled 2014-01-02 (×2): qty 1

## 2014-01-02 MED ORDER — MIDAZOLAM HCL 5 MG/ML IJ SOLN: 1.0000 mg | Freq: Once | INTRAMUSCULAR | Status: DC

## 2014-01-02 MED ORDER — ALPRAZOLAM 0.5 MG PO TABS
1.0000 mg | ORAL_TABLET | Freq: Three times a day (TID) | ORAL | Status: DC
  Administered 2014-01-02 – 2014-01-03 (×3): 1 mg via ORAL
  Filled 2014-01-02 (×3): qty 2

## 2014-01-02 MED ORDER — PROPOFOL INFUSION 10 MG/ML OPTIME
INTRAVENOUS | Status: DC | PRN
  Administered 2014-01-02: 50 ug/kg/min via INTRAVENOUS

## 2014-01-02 MED ORDER — CHLORTHALIDONE 50 MG PO TABS: 100.0000 mg | ORAL_TABLET | Freq: Every day | ORAL | Status: DC

## 2014-01-02 MED ORDER — OXYCODONE HCL 5 MG PO TABS
5.0000 mg | ORAL_TABLET | Freq: Once | ORAL | Status: AC | PRN
  Administered 2014-01-02: 5 mg via ORAL

## 2014-01-02 MED ORDER — FENTANYL CITRATE 0.05 MG/ML IJ SOLN
INTRAMUSCULAR | Status: AC
  Filled 2014-01-02: qty 5

## 2014-01-02 MED ORDER — EPHEDRINE SULFATE 50 MG/ML IJ SOLN
INTRAMUSCULAR | Status: AC
  Filled 2014-01-02: qty 1

## 2014-01-02 MED ORDER — SUCCINYLCHOLINE CHLORIDE 20 MG/ML IJ SOLN
INTRAMUSCULAR | Status: AC
  Filled 2014-01-02: qty 1

## 2014-01-02 MED ORDER — METOCLOPRAMIDE HCL 5 MG/ML IJ SOLN: 5.0000 mg | Freq: Three times a day (TID) | INTRAMUSCULAR | Status: DC | PRN

## 2014-01-02 MED ORDER — ACETAMINOPHEN 650 MG RE SUPP: 650.0000 mg | Freq: Four times a day (QID) | RECTAL | Status: DC | PRN

## 2014-01-02 MED ORDER — MIDAZOLAM HCL 2 MG/2ML IJ SOLN
INTRAMUSCULAR | Status: AC
  Filled 2014-01-02: qty 2

## 2014-01-02 MED ORDER — ALUM & MAG HYDROXIDE-SIMETH 200-200-20 MG/5ML PO SUSP: 30.0000 mL | ORAL | Status: DC | PRN

## 2014-01-02 MED ORDER — MIDAZOLAM HCL 5 MG/5ML IJ SOLN
INTRAMUSCULAR | Status: DC | PRN
  Administered 2014-01-02 (×2): 1 mg via INTRAVENOUS

## 2014-01-02 MED ORDER — CELECOXIB 200 MG PO CAPS
200.0000 mg | ORAL_CAPSULE | Freq: Two times a day (BID) | ORAL | Status: DC
  Administered 2014-01-02 – 2014-01-03 (×2): 200 mg via ORAL
  Filled 2014-01-02 (×3): qty 1

## 2014-01-02 MED ORDER — POTASSIUM CHLORIDE IN NACL 20-0.9 MEQ/L-% IV SOLN
INTRAVENOUS | Status: DC
  Administered 2014-01-02 – 2014-01-03 (×2): via INTRAVENOUS
  Filled 2014-01-02 (×4): qty 1000

## 2014-01-02 MED ORDER — CEFAZOLIN SODIUM-DEXTROSE 2-3 GM-% IV SOLR
2.0000 g | Freq: Four times a day (QID) | INTRAVENOUS | Status: AC
  Administered 2014-01-02 (×2): 2 g via INTRAVENOUS
  Filled 2014-01-02 (×2): qty 50

## 2014-01-02 MED ORDER — SODIUM CHLORIDE 0.9 % IR SOLN
Status: DC | PRN
  Administered 2014-01-02: 3000 mL

## 2014-01-02 MED ORDER — ROCURONIUM BROMIDE 50 MG/5ML IV SOLN
INTRAVENOUS | Status: AC
  Filled 2014-01-02: qty 1

## 2014-01-02 MED ORDER — HYDROMORPHONE HCL 1 MG/ML IJ SOLN
1.0000 mg | INTRAMUSCULAR | Status: DC | PRN
  Administered 2014-01-02: 1 mg via INTRAVENOUS
  Filled 2014-01-02: qty 1

## 2014-01-02 MED ORDER — DEXAMETHASONE SODIUM PHOSPHATE 10 MG/ML IJ SOLN
INTRAMUSCULAR | Status: AC
  Filled 2014-01-02: qty 1

## 2014-01-02 MED ORDER — ASPIRIN EC 325 MG PO TBEC
325.0000 mg | DELAYED_RELEASE_TABLET | Freq: Every day | ORAL | Status: DC
  Administered 2014-01-03: 325 mg via ORAL
  Filled 2014-01-02 (×2): qty 1

## 2014-01-02 MED ORDER — DULOXETINE HCL 60 MG PO CPEP
60.0000 mg | ORAL_CAPSULE | Freq: Every day | ORAL | Status: DC
  Administered 2014-01-03: 60 mg via ORAL
  Filled 2014-01-02: qty 1

## 2014-01-02 MED ORDER — OXYCODONE HCL ER 20 MG PO T12A
20.0000 mg | EXTENDED_RELEASE_TABLET | Freq: Two times a day (BID) | ORAL | Status: DC
  Administered 2014-01-02 – 2014-01-03 (×2): 20 mg via ORAL
  Filled 2014-01-02 (×2): qty 1

## 2014-01-02 MED ORDER — OXYCODONE HCL 5 MG PO TABS
5.0000 mg | ORAL_TABLET | ORAL | Status: DC | PRN
  Administered 2014-01-02 – 2014-01-03 (×5): 10 mg via ORAL
  Filled 2014-01-02 (×5): qty 2

## 2014-01-02 MED ORDER — PROPOFOL 10 MG/ML IV BOLUS
INTRAVENOUS | Status: AC
  Filled 2014-01-02: qty 20

## 2014-01-02 MED ORDER — LIDOCAINE HCL (CARDIAC) 20 MG/ML IV SOLN
INTRAVENOUS | Status: AC
  Filled 2014-01-02: qty 5

## 2014-01-02 MED ORDER — ONDANSETRON HCL 4 MG/2ML IJ SOLN
INTRAMUSCULAR | Status: AC
  Filled 2014-01-02: qty 2

## 2014-01-02 MED ORDER — ACETAMINOPHEN 325 MG PO TABS: 650.0000 mg | ORAL_TABLET | Freq: Four times a day (QID) | ORAL | Status: DC | PRN

## 2014-01-02 MED ORDER — PHENOL 1.4 % MT LIQD: 1.0000 | OROMUCOSAL | Status: DC | PRN

## 2014-01-02 MED ORDER — ONDANSETRON HCL 4 MG PO TABS: 4.0000 mg | ORAL_TABLET | Freq: Four times a day (QID) | ORAL | Status: DC | PRN

## 2014-01-02 MED ORDER — FENTANYL CITRATE 0.05 MG/ML IJ SOLN
INTRAMUSCULAR | Status: AC
  Filled 2014-01-02: qty 2

## 2014-01-02 MED ORDER — ARIPIPRAZOLE 10 MG PO TABS
10.0000 mg | ORAL_TABLET | Freq: Every day | ORAL | Status: DC
  Administered 2014-01-03: 10 mg via ORAL
  Filled 2014-01-02: qty 1

## 2014-01-02 MED ORDER — BUPIVACAINE-EPINEPHRINE (PF) 0.25% -1:200000 IJ SOLN
INTRAMUSCULAR | Status: AC
  Filled 2014-01-02: qty 30

## 2014-01-02 MED ORDER — MILNACIPRAN HCL 100 MG PO TABS: 100.0000 mg | ORAL_TABLET | Freq: Two times a day (BID) | ORAL | Status: DC

## 2014-01-02 MED ORDER — OXYCODONE HCL 5 MG PO TABS
ORAL_TABLET | ORAL | Status: AC
  Filled 2014-01-02: qty 1

## 2014-01-02 MED ORDER — MENTHOL 3 MG MT LOZG: 1.0000 | LOZENGE | OROMUCOSAL | Status: DC | PRN

## 2014-01-02 MED ORDER — POTASSIUM CHLORIDE CRYS ER 10 MEQ PO TBCR
10.0000 meq | EXTENDED_RELEASE_TABLET | Freq: Every day | ORAL | Status: DC
  Administered 2014-01-03: 10 meq via ORAL
  Filled 2014-01-02: qty 1

## 2014-01-02 MED ORDER — ONDANSETRON HCL 4 MG/2ML IJ SOLN: 4.0000 mg | Freq: Once | INTRAMUSCULAR | Status: DC | PRN

## 2014-01-02 MED ORDER — DEXAMETHASONE SODIUM PHOSPHATE 10 MG/ML IJ SOLN
INTRAMUSCULAR | Status: DC | PRN
  Administered 2014-01-02: 10 mg via INTRAVENOUS

## 2014-01-02 MED ORDER — ACETAMINOPHEN 160 MG/5ML PO SOLN
325.0000 mg | ORAL | Status: DC | PRN
  Filled 2014-01-02: qty 20.3

## 2014-01-02 MED ORDER — ACETAMINOPHEN 325 MG PO TABS: 325.0000 mg | ORAL_TABLET | ORAL | Status: DC | PRN

## 2014-01-02 MED ORDER — FENTANYL CITRATE 0.05 MG/ML IJ SOLN
50.0000 ug | INTRAMUSCULAR | Status: DC | PRN
  Administered 2014-01-02: 25 ug via INTRAVENOUS
  Administered 2014-01-02: 50 ug via INTRAVENOUS
  Administered 2014-01-02 (×3): 25 ug via INTRAVENOUS
  Filled 2014-01-02: qty 2

## 2014-01-02 MED ORDER — ONDANSETRON HCL 4 MG/2ML IJ SOLN: 4.0000 mg | Freq: Four times a day (QID) | INTRAMUSCULAR | Status: DC | PRN

## 2014-01-02 MED ORDER — BISACODYL 5 MG PO TBEC: 10.0000 mg | DELAYED_RELEASE_TABLET | Freq: Every day | ORAL | Status: DC

## 2014-01-02 MED ORDER — METOCLOPRAMIDE HCL 5 MG PO TABS
5.0000 mg | ORAL_TABLET | Freq: Three times a day (TID) | ORAL | Status: DC | PRN
  Filled 2014-01-02: qty 2

## 2014-01-02 MED ORDER — OXYCODONE HCL 5 MG/5ML PO SOLN: 5.0000 mg | Freq: Once | ORAL | Status: AC | PRN

## 2014-01-02 MED ORDER — AMITRIPTYLINE HCL 50 MG PO TABS
50.0000 mg | ORAL_TABLET | Freq: Every day | ORAL | Status: DC
  Administered 2014-01-02: 50 mg via ORAL
  Filled 2014-01-02 (×2): qty 1

## 2014-01-02 SURGICAL SUPPLY — 70 items
BANDAGE ELASTIC 6 VELCRO ST LF (GAUZE/BANDAGES/DRESSINGS) ×3 IMPLANT
BANDAGE ESMARK 6X9 LF (GAUZE/BANDAGES/DRESSINGS) ×1 IMPLANT
BENZOIN TINCTURE PRP APPL 2/3 (GAUZE/BANDAGES/DRESSINGS) ×3 IMPLANT
BLADE SAGITTAL 25.0X1.19X90 (BLADE) ×2 IMPLANT
BLADE SAGITTAL 25.0X1.19X90MM (BLADE) ×1
BLADE SAW SGTL 11.0X1.19X90.0M (BLADE) IMPLANT
BLADE SAW SGTL 13.0X1.19X90.0M (BLADE) ×3 IMPLANT
BLADE SURG 10 STRL SS (BLADE) ×6 IMPLANT
BNDG ELASTIC 6X15 VLCR STRL LF (GAUZE/BANDAGES/DRESSINGS) ×3 IMPLANT
BNDG ESMARK 6X9 LF (GAUZE/BANDAGES/DRESSINGS) ×3
BOWL SMART MIX CTS (DISPOSABLE) ×3 IMPLANT
CAP KNEE TOTAL 3 SIGMA ×3 IMPLANT
CEMENT HV SMART SET (Cement) ×6 IMPLANT
CLOSURE WOUND 1/2 X4 (GAUZE/BANDAGES/DRESSINGS) ×1
COVER SURGICAL LIGHT HANDLE (MISCELLANEOUS) ×3 IMPLANT
CUFF TOURNIQUET SINGLE 34IN LL (TOURNIQUET CUFF) ×3 IMPLANT
CUFF TOURNIQUET SINGLE 44IN (TOURNIQUET CUFF) IMPLANT
DRAPE EXTREMITY T 121X128X90 (DRAPE) ×3 IMPLANT
DRAPE IMP U-DRAPE 54X76 (DRAPES) ×3 IMPLANT
DRAPE INCISE IOBAN 66X45 STRL (DRAPES) ×3 IMPLANT
DRAPE PROXIMA HALF (DRAPES) ×3 IMPLANT
DRAPE U-SHAPE 47X51 STRL (DRAPES) ×3 IMPLANT
DRSG AQUACEL AG ADV 3.5X14 (GAUZE/BANDAGES/DRESSINGS) ×3 IMPLANT
DRSG PAD ABDOMINAL 8X10 ST (GAUZE/BANDAGES/DRESSINGS) ×3 IMPLANT
DURAPREP 26ML APPLICATOR (WOUND CARE) ×6 IMPLANT
ELECT CAUTERY BLADE 6.4 (BLADE) ×3 IMPLANT
ELECT REM PT RETURN 9FT ADLT (ELECTROSURGICAL) ×3
ELECTRODE REM PT RTRN 9FT ADLT (ELECTROSURGICAL) ×1 IMPLANT
EVACUATOR 1/8 PVC DRAIN (DRAIN) ×3 IMPLANT
FACESHIELD WRAPAROUND (MASK) ×3 IMPLANT
GAUZE SPONGE 4X4 12PLY STRL (GAUZE/BANDAGES/DRESSINGS) ×3 IMPLANT
GLOVE BIO SURGEON STRL SZ7 (GLOVE) ×3 IMPLANT
GLOVE BIOGEL PI IND STRL 7.0 (GLOVE) ×1 IMPLANT
GLOVE BIOGEL PI IND STRL 7.5 (GLOVE) ×1 IMPLANT
GLOVE BIOGEL PI INDICATOR 7.0 (GLOVE) ×2
GLOVE BIOGEL PI INDICATOR 7.5 (GLOVE) ×2
GLOVE SS BIOGEL STRL SZ 7.5 (GLOVE) ×1 IMPLANT
GLOVE SUPERSENSE BIOGEL SZ 7.5 (GLOVE) ×2
GOWN STRL REUS W/ TWL LRG LVL3 (GOWN DISPOSABLE) ×2 IMPLANT
GOWN STRL REUS W/ TWL XL LVL3 (GOWN DISPOSABLE) ×2 IMPLANT
GOWN STRL REUS W/TWL LRG LVL3 (GOWN DISPOSABLE) ×4
GOWN STRL REUS W/TWL XL LVL3 (GOWN DISPOSABLE) ×4
HANDPIECE INTERPULSE COAX TIP (DISPOSABLE) ×2
HOOD PEEL AWAY FACE SHEILD DIS (HOOD) ×6 IMPLANT
IMMOBILIZER KNEE 22 UNIV (SOFTGOODS) IMPLANT
KIT BASIN OR (CUSTOM PROCEDURE TRAY) ×3 IMPLANT
KIT ROOM TURNOVER OR (KITS) ×3 IMPLANT
MANIFOLD NEPTUNE II (INSTRUMENTS) ×3 IMPLANT
MARKER SKIN DUAL TIP RULER LAB (MISCELLANEOUS) ×3 IMPLANT
NS IRRIG 1000ML POUR BTL (IV SOLUTION) ×3 IMPLANT
PACK TOTAL JOINT (CUSTOM PROCEDURE TRAY) ×3 IMPLANT
PACK UNIVERSAL I (CUSTOM PROCEDURE TRAY) ×3 IMPLANT
PAD ARMBOARD 7.5X6 YLW CONV (MISCELLANEOUS) ×6 IMPLANT
PADDING CAST COTTON 6X4 STRL (CAST SUPPLIES) ×3 IMPLANT
RUBBERBAND STERILE (MISCELLANEOUS) ×3 IMPLANT
SET HNDPC FAN SPRY TIP SCT (DISPOSABLE) ×1 IMPLANT
SPONGE GAUZE 4X4 12PLY STER LF (GAUZE/BANDAGES/DRESSINGS) ×3 IMPLANT
STRIP CLOSURE SKIN 1/2X4 (GAUZE/BANDAGES/DRESSINGS) ×2 IMPLANT
SUCTION FRAZIER TIP 10 FR DISP (SUCTIONS) ×3 IMPLANT
SUT ETHIBOND NAB CT1 #1 30IN (SUTURE) ×6 IMPLANT
SUT MNCRL AB 3-0 PS2 18 (SUTURE) ×3 IMPLANT
SUT VIC AB 0 CT1 27 (SUTURE) ×4
SUT VIC AB 0 CT1 27XBRD ANBCTR (SUTURE) ×2 IMPLANT
SUT VIC AB 2-0 CT1 27 (SUTURE) ×4
SUT VIC AB 2-0 CT1 TAPERPNT 27 (SUTURE) ×2 IMPLANT
SYR 30ML SLIP (SYRINGE) ×3 IMPLANT
TOWEL OR 17X24 6PK STRL BLUE (TOWEL DISPOSABLE) ×3 IMPLANT
TOWEL OR 17X26 10 PK STRL BLUE (TOWEL DISPOSABLE) ×3 IMPLANT
TRAY FOLEY CATH 16FR SILVER (SET/KITS/TRAYS/PACK) ×3 IMPLANT
WATER STERILE IRR 1000ML POUR (IV SOLUTION) ×6 IMPLANT

## 2014-01-02 NOTE — Anesthesia Procedure Notes (Addendum)
Anesthesia Regional Block:  Adductor canal block  Pre-Anesthetic Checklist: ,, timeout performed, Correct Patient, Correct Site, Correct Laterality, Correct Procedure, Correct Position, site marked, Risks and benefits discussed,  Surgical consent,  Pre-op evaluation,  At surgeon's request and post-op pain management  Laterality: Left  Prep: chloraprep       Needles:   Needle Type: Echogenic Stimulator Needle     Needle Length: 9cm 9 cm Needle Gauge: 22 and 22 G    Additional Needles:  Procedures: ultrasound guided (picture in chart) Adductor canal block Narrative:  Start time: 01/02/2014 8:50 AM End time: 01/02/2014 8:55 AM Injection made incrementally with aspirations every 5 mL.  Performed by: Personally   Additional Notes: 30 cc 0.5% bupivacaine with 1:200 epi injected easily   Spinal  Start time: 01/02/2014 9:00 AM End time: 01/02/2014 9:05 AM Staffing Performed by: anesthesiologist  Preanesthetic Checklist Completed: patient identified, site marked, surgical consent, pre-op evaluation, timeout performed, IV checked, risks and benefits discussed and monitors and equipment checked Spinal Block Patient position: sitting Prep: DuraPrep Patient monitoring: heart rate, cardiac monitor, continuous pulse ox and blood pressure Approach: midline Location: L3-4 Injection technique: single-shot Needle Needle type: Tuohy  Needle gauge: 22 G Needle length: 9 cm Assessment Sensory level: T8 Events: injection painful Additional Notes 7.5 mg 0.75% Marcaine 1:200 Epi clear CSF    Procedure Name: MAC Date/Time: 01/02/2014 9:00 AM Performed by: Elon AlasLEE, HEATHER BROWN Pre-anesthesia Checklist: Patient identified, Emergency Drugs available, Suction available, Patient being monitored and Timeout performed Patient Re-evaluated:Patient Re-evaluated prior to inductionOxygen Delivery Method: Simple face mask Intubation Type: IV induction Placement Confirmation: positive ETCO2  and breath sounds checked- equal and bilateral Dental Injury: Teeth and Oropharynx as per pre-operative assessment

## 2014-01-02 NOTE — Progress Notes (Signed)
Pt reports that office recollected urine last week and has results.

## 2014-01-02 NOTE — Transfer of Care (Signed)
Immediate Anesthesia Transfer of Care Note  Patient: [name] BackDeborah Eckels  Procedure(s) Performed: Procedure(s): LEFT TOTAL KNEE ARTHROPLASTY (Left)  Patient Location: PACU  Anesthesia Type:Regional and Spinal  Level of Consciousness: awake, alert  and oriented  Airway & Oxygen Therapy: Patient Spontanous Breathing  Post-op Assessment: Report given to PACU RN, Post -op Vital signs reviewed and stable and Patient moving all extremities X 4  Post vital signs: Reviewed and stable  Complications: No apparent anesthesia complications

## 2014-01-02 NOTE — Anesthesia Preprocedure Evaluation (Addendum)
Anesthesia Evaluation  Patient identified by MRN, date of birth, ID band Patient awake    Reviewed: Allergy & Precautions, H&P , NPO status , Patient's Chart, lab work & pertinent test results  Airway Mallampati: III  TM Distance: >3 FB Neck ROM: Full    Dental  (+) Dental Advisory Given   Pulmonary sleep apnea and Continuous Positive Airway Pressure Ventilation ,  breath sounds clear to auscultation        Cardiovascular Rhythm:Regular Rate:Normal     Neuro/Psych Anxiety Depression    GI/Hepatic GERD-  ,(+) Hepatitis -, C  Endo/Other  Hypothyroidism Morbid obesity  Renal/GU      Musculoskeletal  (+) Arthritis -,   Abdominal   Peds  Hematology   Anesthesia Other Findings   Reproductive/Obstetrics                            Anesthesia Physical Anesthesia Plan  ASA: III  Anesthesia Plan: General   Post-op Pain Management: MAC Combined w/ Regional for Post-op pain   Induction: Intravenous  Airway Management Planned: Oral ETT  Additional Equipment:   Intra-op Plan:   Post-operative Plan: Extubation in OR  Informed Consent: I have reviewed the patients History and Physical, chart, labs and discussed the procedure including the risks, benefits and alternatives for the proposed anesthesia with the patient or authorized representative who has indicated his/her understanding and acceptance.   Dental advisory given  Plan Discussed with: Anesthesiologist and Surgeon  Anesthesia Plan Comments:         Anesthesia Quick Evaluation

## 2014-01-02 NOTE — Interval H&P Note (Signed)
History and Physical Interval Note:  01/02/2014 8:46 AM  [name] Mcgee  has presented today for surgery, with the diagnosis of PRIMARY LOCALIZED OA LEFT KNEE  The various methods of treatment have been discussed with the patient and family. After consideration of risks, benefits and other options for treatment, the patient has consented to  Procedure(s): LEFT TOTAL KNEE ARTHROPLASTY (Left) as a surgical intervention .  The patient's history has been reviewed, patient examined, no change in status, stable for surgery.  I have reviewed the patient's chart and labs.  Questions were answered to the patient's satisfaction.     Salvatore MarvelWAINER,[place] A

## 2014-01-02 NOTE — Progress Notes (Signed)
Orthopedic Tech Progress Note Patient Details:  [name] BackDeborah Mcgee Dec 09, 1960 [id number] CPM applied to LLE with appropriate settings. OHF applied to bed. Footsie roll provided. Application tolerated well.  CPM Left Knee CPM Left Knee: On Left Knee Flexion (Degrees): 90 Left Knee Extension (Degrees): 0   Asia R Thompson 01/02/2014, 11:56 AM

## 2014-01-02 NOTE — Op Note (Signed)
MRN:     [id number] DOB/AGE:    Nov 06, 1960 / 53 y.o.       OPERATIVE REPORT    DATE OF PROCEDURE:  01/02/2014       PREOPERATIVE DIAGNOSIS:   PRIMARY LOCALIZED OA LEFT KNEE      Estimated body mass index is 47.82 kg/(m^2) as calculated from the following:   Height as of this encounter: 5' 2.5" (1.588 m).   Weight as of this encounter: 120.6 kg (265 lb 14 oz).                                                        POSTOPERATIVE DIAGNOSIS:   PRIMARY LOCALIZED OA LEFT KNEE                                                                      PROCEDURE:  Procedure(s): LEFT TOTAL KNEE ARTHROPLASTY Using Depuy Sigma RP implants #3 Femur, #3Tibia, 10mm sigma RP bearing, 32 Patella     SURGEON: Khrystal Jeanmarie A    ASSISTANT:  Kirstin Shepperson PA-C   (Present and scrubbed throughout the case, critical for assistance with exposure, retraction, instrumentation, and closure.)         ANESTHESIA: SPINAL with Femoral Nerve Block  DRAINS: foley, 2 medium hemovac in knee   TOURNIQUET TIME: 75min   COMPLICATIONS:  None     SPECIMENS: None   INDICATIONS FOR PROCEDURE: The patient has  DJD LEFT KNEE, varus deformities, XR shows bone on bone arthritis. Patient has failed all conservative measures including anti-inflammatory medicines, narcotics, attempts at  exercise and weight loss, cortisone injections and viscosupplementation.  Risks and benefits of surgery have been discussed, questions answered.   DESCRIPTION OF PROCEDURE: The patient identified by armband, received  right femoral nerve block and IV antibiotics, [facility]. Patient taken to the operating room, appropriate anesthetic  monitors were attached General endotracheal anesthesia induced with  the patient in supine position, Foley catheter was inserted. Tourniquet  applied high to the operative thigh. Lateral post and foot positioner  applied to the table, the lower extremity was then prepped and draped   in usual sterile fashion from the ankle to the tourniquet. Time-out procedure was performed. The limb was wrapped with an Esmarch bandage and the tourniquet inflated to 365 mmHg. We began the operation by making the anterior midline incision starting at handbreadth above the patella going over the patella 1 cm medial to and  4 cm distal to the tibial tubercle. Small bleeders in the skin and the  subcutaneous tissue identified and cauterized. Transverse retinaculum was incised and reflected medially and a medial parapatellar arthrotomy was accomplished. the patella was everted and theprepatellar fat pad resected. The superficial medial collateral  ligament was then elevated from anterior to posterior along the proximal  flare of the tibia and anterior half of the menisci resected. The knee was hyperflexed exposing bone on bone arthritis. Peripheral and notch osteophytes as well as the cruciate ligaments were then resected. We continued to  work our way around  [id number], maintenance (IT)Judie PetitElvina Sidle[address]Lucila Maine[address] Center LLCDoralee AlbinoTribune CompanySt Marys HospitalBernette RedbirdCarson [facility] [id number], maintenance (IT)Judie PetitElvina Sidle[address]Lucila Maine[address] PsDoralee AlbinoTribune CompanySan Dimas Community HospitalBernette RedbirdV [facility] 33mDiamond NickelMicronesiaDe [phone] Parody [address] Wind[address] [phone] [phone] [id number]52w4dJonette [id number], maintenance (IT)Judie PetitElvina Sidle[address]Lucila Maine[address]Southern Ocean County HospitalDoralee AlbinoTribune CompanyGlenn Medical CenterBernette RedbirdMiami Valley Hospital 16mDiamond NickelMicronesiaDe Blanch

## 2014-01-02 NOTE — Care Management Note (Unsigned)
    Page 1 of 1   01/02/2014     8:08:38 PM CARE MANAGEMENT NOTE 01/02/2014  Patient:  [facility] [name] Mcgee   Account Number:  [id number]  Date Initiated:  01/02/2014  Documentation initiated by:  Mcgee,[name]  Subjective/Objective Assessment:   53 yo female admitted with a L hip fx after falling. She underwent a L THA.     Action/Plan:   CM consult for HHC/DME.   Anticipated DC Date:  01/03/2014   Anticipated DC Plan:  SKILLED NURSING FACILITY  In-house referral  Clinical Social Worker      DC Planning Services  CM consult      Choice offered to / List presented to:             Status of service:  Completed, signed off Medicare Important Message given?   (If response is "NO", the following Medicare IM given date fields will be blank) Date Medicare IM given:   Medicare IM given by:   Date Additional Medicare IM given:  01/02/2014 Additional Medicare IM given by:  [name] CowmanJeannette Mcgee  Discharge Disposition:  SKILLED NURSING FACILITY  Per UR Regulation:    If discussed at Long Length of Stay Meetings, dates discussed:    Comments:  01/02/14 - 2005 [name] CrutchJeannette Oliveras, RN, BSN, CM  PT is recommending SNF. SW is following pt.

## 2014-01-02 NOTE — Plan of Care (Signed)
Problem: Consults Goal: Diagnosis- Total Joint Replacement Primary Total Knee Left     

## 2014-01-03 ENCOUNTER — Encounter (HOSPITAL_COMMUNITY): Payer: Self-pay | Admitting: Orthopedic Surgery

## 2014-01-03 LAB — CBC
HCT: 31.3 % — ABNORMAL LOW (ref 36.0–46.0)
Hemoglobin: 10 g/dL — ABNORMAL LOW (ref 12.0–15.0)
MCH: 27.4 pg (ref 26.0–34.0)
MCHC: 31.9 g/dL (ref 30.0–36.0)
MCV: 85.8 fL (ref 78.0–100.0)
PLATELETS: 282 10*3/uL (ref 150–400)
RBC: 3.65 MIL/uL — ABNORMAL LOW (ref 3.87–5.11)
RDW: 14.7 % (ref 11.5–15.5)
WBC: 11.1 10*3/uL — AB (ref 4.0–10.5)

## 2014-01-03 LAB — HEMOGLOBIN A1C
Hgb A1c MFr Bld: 6.4 % — ABNORMAL HIGH (ref <5.7)
Mean Plasma Glucose: 137 mg/dL — ABNORMAL HIGH (ref <117)

## 2014-01-03 LAB — GLUCOSE, CAPILLARY
Glucose-Capillary: 299 mg/dL — ABNORMAL HIGH (ref 70–99)
Glucose-Capillary: 282 mg/dL — ABNORMAL HIGH (ref 70–99)

## 2014-01-03 LAB — BASIC METABOLIC PANEL
ANION GAP: 9 (ref 5–15)
BUN: 10 mg/dL (ref 6–23)
CALCIUM: 8.2 mg/dL — AB (ref 8.4–10.5)
CHLORIDE: 95 meq/L — AB (ref 96–112)
CO2: 28 mmol/L (ref 19–32)
CREATININE: 1.27 mg/dL — AB (ref 0.50–1.10)
GFR calc Af Amer: 55 mL/min — ABNORMAL LOW (ref >90)
GFR, EST NON AFRICAN AMERICAN: 48 mL/min — AB (ref >90)
Glucose, Bld: 257 mg/dL — ABNORMAL HIGH (ref 70–99)
Potassium: 3.8 mmol/L (ref 3.5–5.1)
Sodium: 132 mmol/L — ABNORMAL LOW (ref 135–145)

## 2014-01-03 MED ORDER — INSULIN ASPART 100 UNIT/ML ~~LOC~~ SOLN
0.0000 [IU] | Freq: Three times a day (TID) | SUBCUTANEOUS | Status: DC
  Administered 2014-01-03 (×2): 8 [IU] via SUBCUTANEOUS

## 2014-01-03 MED ORDER — POTASSIUM CHLORIDE CRYS ER 10 MEQ PO TBCR: 10.0000 meq | EXTENDED_RELEASE_TABLET | Freq: Two times a day (BID) | ORAL | Status: AC

## 2014-01-03 MED ORDER — OXYCODONE HCL ER 20 MG PO T12A: EXTENDED_RELEASE_TABLET | ORAL | Status: AC

## 2014-01-03 MED ORDER — INSULIN ASPART 100 UNIT/ML ~~LOC~~ SOLN: 0.0000 [IU] | Freq: Every day | SUBCUTANEOUS | Status: DC

## 2014-01-03 NOTE — Progress Notes (Signed)
CARE MANAGEMENT NOTE 01/03/2014  Patient:  [facility]   Account Number:  [id number]  Date Initiated:  01/03/2014  Documentation initiated by:  [facility] Eye SurgicenterKRIEG,Kyreese Chio  Subjective/Objective Assessment:   s/p left total knee arthroplasty     Action/Plan:   PT eval-recommended HHPT   Anticipated DC Date:  01/03/2014   Anticipated DC Plan:  HOME W HOME HEALTH SERVICES      DC Planning Services  CM consult      [facility]  DURABLE [facility]ICAL EQUIPMENT  HOME HEALTH   Choice offered to / List presented to:  C-1 Patient   DME arranged  CPM      DME agency  [employer]     HH arranged  HH-2 PT  HH-1 RN      Status of service:  Completed, signed off Medicare Important Message given?   (If response is "NO", the following Medicare IM given date fields will be blank) Date Medicare IM given:   Medicare IM given by:   Date Additional Medicare IM given:   Additional Medicare IM given by:    Discharge Disposition:  HOME W HOME HEALTH SERVICES  Per UR Regulation:  Reviewed for med. necessity/level of care/duration of stay  If discussed at Long Length of Stay Meetings, dates discussed:    Comments:  01/03/14 Spoke with patient about HHC. [facility] set up HH[facility] and RN for diabetes monitoring. Faxed demographics, order, H and P, op note and d/c summary to [phone] and received confirmation. [employer] providing CPM, patient has rolling walker and 3N1. No other d/c needs identified

## 2014-01-03 NOTE — Discharge Summary (Signed)
Patient ID: [name] Mcgee MRN: [id number] DOB/AGE: [birth year] 53 y.o.  Admit date: 01/02/2014 Discharge date: 01/03/2014  Admission Diagnoses:  Principal Problem:   Primary localized osteoarthritis of left knee Active Problems:   Back pain   Thyroid disease   Hepatitis C   GERD (gastroesophageal reflux disease)   Anxiety   Sleep apnea, obstructive   DJD (degenerative joint disease) of knee   Discharge Diagnoses:  Same  Past Medical History  Diagnosis Date  . Back pain   . Right knee DJD   . Thyroid disease   . Depression   . Hepatitis C   . GERD (gastroesophageal reflux disease)   . Anxiety   . Hypothyroidism   . History of kidney stones   . H/O hiatal hernia   . Headache(784.0)     hx migraines  . Primary localized osteoarthritis of left knee   . Pneumonia     hx of  . Sleep apnea, obstructive     wears a CPAP at night started this yr    Surgeries: Procedure(s): LEFT TOTAL KNEE ARTHROPLASTY on 01/02/2014   Consultants:    Discharged Condition: Improved  Hospital Course: [name] BackDeborah Mcgee is an 53 y.o. female who was admitted 01/02/2014 for operative treatment ofPrimary localized osteoarthritis of left knee. Patient has severe unremitting pain that affects sleep, daily activities, and work/hobbies. After pre-op clearance the patient was taken to the operating room on 01/02/2014 and underwent  Procedure(s): LEFT TOTAL KNEE ARTHROPLASTY.    Patient was given perioperative antibiotics: Anti-infectives    Start     Dose/Rate Route Frequency Ordered Stop   01/02/14 1630  ceFAZolin (ANCEF) IVPB 2 g/50 mL premix     2 g100 mL/hr over 30 Minutes Intravenous Every 6 hours 01/02/14 1559 01/02/14 2202   01/02/14 0600  ceFAZolin (ANCEF) IVPB 2 g/50 mL premix  Status:  Discontinued     2 g100 mL/hr over 30 Minutes Intravenous On call to O.R. 01/01/14 1353 01/01/14 1358   01/02/14 0600  ceFAZolin (ANCEF) 3 g in dextrose 5 % 50 mL IVPB     3 g160 mL/hr over 30 Minutes  Intravenous On call to O.R. 01/01/14 1358 01/02/14 0857       Patient was given sequential compression devices, early ambulation, and chemoprophylaxis to prevent DVT.  Patient benefited maximally from hospital stay and there were no complications.    Recent vital signs: Patient Vitals for the past 24 hrs:  BP Temp Temp src Pulse Resp SpO2  01/03/14 0631 115/60 mmHg 98.3 F (36.8 C) Oral (!) 110 - 92 %  01/03/14 0400 - - - - 16 92 %  01/03/14 0129 119/69 mmHg 98.3 F (36.8 C) Oral (!) 116 - 92 %  01/03/14 0000 - - - - 16 95 %  01/02/14 2046 - - - (!) 102 16 95 %  01/02/14 2000 - - - - 16 95 %  01/02/14 1910 129/81 mmHg 98.2 F (36.8 C) - (!) 110 16 96 %  01/02/14 1800 115/71 mmHg 98 F (36.7 C) - (!) 109 15 96 %  01/02/14 1700 114/76 mmHg 98.2 F (36.8 C) - (!) 103 14 95 %  01/02/14 1611 113/73 mmHg 98.3 F (36.8 C) - (!) 102 14 94 %  01/02/14 1545 - 97.8 F (36.6 C) - (!) 104 12 93 %  01/02/14 1530 - - - (!) 104 18 93 %  01/02/14 1518 118/82 mmHg - - (!) 109 16 94 %  01/02/14 1515 - - - Marland Kitchen(!)  111 (!) 21 95 %  01/02/14 1500 - - - (!) 102 12 94 %  01/02/14 1448 114/73 mmHg - - 100 14 98 %  01/02/14 1445 - - - 99 13 95 %  01/02/14 1430 - - - (!) 101 14 97 %  01/02/14 1418 117/66 mmHg - - 98 17 96 %  01/02/14 1415 - - - 100 15 97 %  01/02/14 1400 - - - 98 12 96 %  01/02/14 1348 115/65 mmHg - - 98 12 95 %  01/02/14 1345 - - - 98 14 96 %  01/02/14 1330 - - - 96 14 96 %  01/02/14 1318 109/72 mmHg - - 100 14 96 %  01/02/14 1315 - - - 99 18 94 %  01/02/14 1300 - - - 100 18 95 %  01/02/14 1248 116/75 mmHg - - 97 17 97 %  01/02/14 1245 - - - 97 17 96 %  01/02/14 1230 - 97.8 F (36.6 C) - 93 18 96 %  01/02/14 1218 112/72 mmHg - - 93 17 97 %  01/02/14 1215 - - - 93 13 98 %  01/02/14 1210 - - - 94 16 97 %  01/02/14 1203 109/70 mmHg - - 92 19 97 %  01/02/14 1200 - - - 95 (!) 24 96 %  01/02/14 1148 (!) 88/64 mmHg - - 94 16 98 %  01/02/14 1145 - - - 97 19 97 %  01/02/14 1133  115/76 mmHg - - 94 12 90 %  01/02/14 1130 - - - 96 16 92 %  01/02/14 1118 121/83 mmHg - - 96 18 92 %  01/02/14 1115 - - - 98 18 93 %  01/02/14 1104 - 97.9 F (36.6 C) - - - -  01/02/14 1103 111/71 mmHg - - (!) 103 12 93 %     Recent laboratory studies:  Recent Labs  01/02/14 0906 01/03/14 0500  WBC  --  11.1*  HGB 12.2 10.0*  HCT 36.0 31.3*  PLT  --  282  NA 139 132*  K 3.1* 3.8  CL  --  95*  CO2  --  28  BUN  --  10  CREATININE  --  1.27*  GLUCOSE 117* 257*  CALCIUM  --  8.2*     Discharge Medications:     Medication List    STOP taking these medications        HYDROcodone-acetaminophen 7.5-325 MG per tablet  Commonly known as:  NORCO      TAKE these medications        ALPRAZolam 1 MG tablet  Commonly known as:  XANAX  Take 1 mg by mouth 2 (two) times daily.     amitriptyline 50 MG tablet  Commonly known as:  ELAVIL  Take 50 mg by mouth at bedtime.     ARIPiprazole 10 MG tablet  Commonly known as:  ABILIFY  Take 10 mg by mouth daily.     aspirin 325 MG EC tablet  1 tab a day for the next 30 days to prevent blood clots     baclofen 10 MG tablet  Commonly known as:  LIORESAL  Take 10 mg by mouth 2 (two) times daily.     bisacodyl 5 MG EC tablet  Commonly known as:  bisacodyl  Take 2 tablets every night with dinner until bowel movement.  LAXITIVE.  Restart if two days since last bowel movement     celecoxib 200 MG  capsule  Commonly known as:  CELEBREX  1 tab po q day with food for pain and  swelling     chlorthalidone 100 MG tablet  Commonly known as:  HYGROTEN  Take 100 mg by mouth daily.     DSS 100 MG Caps  1 tab 2 times a day while on narcotics.  STOOL SOFTENER     DULoxetine 60 MG capsule  Commonly known as:  CYMBALTA  Take 60 mg by mouth daily.     levothyroxine 150 MCG tablet  Commonly known as:  SYNTHROID, LEVOTHROID  Take 150 mcg by mouth daily before breakfast.     OxyCODONE 20 mg T12a 12 hr tablet  Commonly known as:   OXYCONTIN  1 tablet every 12 hours.  LONG ACTING PAIN MEDICATION     potassium chloride 10 MEQ tablet  Commonly known as:  K-DUR,KLOR-CON  Take 1 tablet (10 mEq total) by mouth 2 (two) times daily.     SAVELLA 100 MG Tabs tablet  Generic drug:  Milnacipran HCl  Take 100 mg by mouth 2 (two) times daily.     VITAMIN D PO  Take 1 tablet by mouth daily.        Diagnostic Studies: No results found.  Disposition: 06-Home-Health Care Svc      Discharge Instructions    CPM    Complete by:  As directed   Continuous passive motion machine (CPM):      Use the CPM from 0 to 90 for 6 hours per day.       You may break it up into 2 or 3 sessions per day.      Use CPM for 2 weeks or until you are told to stop.     Call MD / Call 911    Complete by:  As directed   If you experience chest pain or shortness of breath, CALL 911 and be transported to the hospital emergency room.  If you develope a fever above 101 F, pus (white drainage) or increased drainage or redness at the wound, or calf pain, call your surgeon's office.     Change dressing    Complete by:  As directed   DO NOT REMOVE BANDAGE OVER SURGICAL INCISION.  WASH WHOLE LEG INCLUDING OVER THE WATERPROOF BANDAGE WITH SOAP AND WATER EVERY DAY.     Constipation Prevention    Complete by:  As directed   Drink plenty of fluids.  Prune juice may be helpful.  You may use a stool softener, such as Colace (over the counter) 100 mg twice a day.  Use MiraLax (over the counter) for constipation as needed.     Diet - low sodium heart healthy    Complete by:  As directed      Discharge instructions    Complete by:  As directed   Total Knee Replacement Care After Refer to this sheet in the next few weeks. These discharge instructions provide you with general information on caring for yourself after you leave the hospital. Your caregiver may also give you specific instructions. Your treatment has been planned according to the most current medical  practices available, but unavoidable complications sometimes occur. If you have any problems or questions after discharge, please call your caregiver. Regaining a near full range of motion of your knee within the first 3 to 6 weeks after surgery is critical. HOME CARE INSTRUCTIONS  You may resume a normal diet and activities as directed.  Perform exercises as  directed.  Place gray foam block, curve side up under heel at all times except when in CPM or when walking.  DO NOT modify, tear, cut, or change in any way the gray foam block. You will receive physical therapy daily  Take showers instead of baths until informed otherwise.  You may shower on Sunday.  Please wash whole leg including wound with soap and water  Keep bandage over wound.  Wash whole leg including over the bandage every day with soap and water. It is OK to take over-the-counter tylenol in addition to the oxycodone for pain, discomfort, or fever. Oxycodone is VERY constipating.  Please take stool softener twice a day and laxatives daily until bowels are regular Eat a well-balanced diet.  Avoid lifting or driving until you are instructed otherwise.  Make an appointment to see your caregiver for stitches (suture) or staple removal as directed.  If you have been sent home with a continuous passive motion machine (CPM machine), 0-90 degrees 6 hrs a day   2 hrs a shift SEEK MEDICAL CARE IF: You have swelling of your calf or leg.  You develop shortness of breath or chest pain.  You have redness, swelling, or increasing pain in the wound.  There is pus or any unusual drainage coming from the surgical site.  You notice a bad smell coming from the surgical site or dressing.  The surgical site breaks open after sutures or staples have been removed.  There is persistent bleeding from the suture or staple line.  You are getting worse or are not improving.  You have any other questions or concerns.  SEEK IMMEDIATE MEDICAL CARE IF:  You  have a fever.  You develop a rash.  You have difficulty breathing.  You develop any reaction or side effects to medicines given.  Your knee motion is decreasing rather than improving.  MAKE SURE YOU:  Understand these instructions.  Will watch your condition.  Will get help right away if you are not doing well or get worse.     Do not put a pillow under the knee. Place it under the heel.    Complete by:  As directed   Place gray foam block, curve side up under heel at all times except when in CPM or when walking.  DO NOT modify, tear, cut, or change in any way the gray foam block.     Increase activity slowly as tolerated    Complete by:  As directed      TED hose    Complete by:  As directed   Use stockings (TED hose) for 2 weeks on both leg(s).  You may remove them at night for sleeping.           Follow-up Information    Follow up with Nilda Simmer, MD On 01/17/2014.   Specialty:  Orthopedic Surgery   Why:  appt time 9 am   Contact information:   [address] [place] [address] [phone]        Signed: Pascal Lux 01/03/2014, 10:22 AM

## 2014-01-03 NOTE — Progress Notes (Addendum)
Inpatient Diabetes Program Recommendations  AACE/ADA: New Consensus Statement on Inpatient Glycemic Control (2013)  Target Ranges:  Prepandial:   less than 140 mg/dL      Peak postprandial:   less than 180 mg/dL (1-2 hours)      Critically ill patients:  140 - 180 mg/dL   Reason for assessment: elevated fasting blood sugars  Diabetes history: none Outpatient Diabetes medications: none Current orders for Inpatient glycemic control: Novolog 0-15 units tid with meals  Spoke to RN caring for patient today; discussed the need to have patient follow up with family doctor post discharge as CBG elevated while patient was in hospital.  Susette RacerJulie [place], RN, BA, MHA, CDE Diabetes Coordinator Inpatient Diabetes Program  [phone] (Team Pager) [phone] Patrcia Dolly([place] Office) 01/03/2014 12:58 PM    Spoke with patient and her husband regarding her elevated CBG and slightly elevated A1C. She expressed the knowledge of the side effects of elevated CBG- patient's father had diabetes and had numerous complications of diabetes. Patient has a follow up appointment with her family doctor in the next couple of weeks- will discuss with MD.   Susette RacerJulie Henretta Quist, RN, BA, MHA, CDE Diabetes Coordinator Inpatient Diabetes Program  [phone] (Team Pager) [phone] Patrcia Dolly([place] Office) 01/03/2014 1:29 PM

## 2014-01-03 NOTE — Progress Notes (Signed)
[name] Mcgee to be D/C'd Home per MD order. Discussed with the patient and all questions fully answered.    Medication List    STOP taking these medications        HYDROcodone-acetaminophen 7.5-325 MG per tablet  Commonly known as:  NORCO      TAKE these medications        ALPRAZolam 1 MG tablet  Commonly known as:  XANAX  Take 1 mg by mouth 2 (two) times daily.     amitriptyline 50 MG tablet  Commonly known as:  ELAVIL  Take 50 mg by mouth at bedtime.     ARIPiprazole 10 MG tablet  Commonly known as:  ABILIFY  Take 10 mg by mouth daily.     aspirin 325 MG EC tablet  1 tab a day for the next 30 days to prevent blood clots     baclofen 10 MG tablet  Commonly known as:  LIORESAL  Take 10 mg by mouth 2 (two) times daily.     bisacodyl 5 MG EC tablet  Commonly known as:  bisacodyl  Take 2 tablets every night with dinner until bowel movement.  LAXITIVE.  Restart if two days since last bowel movement     celecoxib 200 MG capsule  Commonly known as:  CELEBREX  1 tab po q day with food for pain and  swelling     chlorthalidone 100 MG tablet  Commonly known as:  HYGROTEN  Take 100 mg by mouth daily.     DSS 100 MG Caps  1 tab 2 times a day while on narcotics.  STOOL SOFTENER     DULoxetine 60 MG capsule  Commonly known as:  CYMBALTA  Take 60 mg by mouth daily.     levothyroxine 150 MCG tablet  Commonly known as:  SYNTHROID, LEVOTHROID  Take 150 mcg by mouth daily before breakfast.     OxyCODONE 20 mg T12a 12 hr tablet  Commonly known as:  OXYCONTIN  1 tablet every 12 hours.  LONG ACTING PAIN MEDICATION     potassium chloride 10 MEQ tablet  Commonly known as:  K-DUR,KLOR-CON  Take 1 tablet (10 mEq total) by mouth 2 (two) times daily.     SAVELLA 100 MG Tabs tablet  Generic drug:  Milnacipran HCl  Take 100 mg by mouth 2 (two) times daily.     VITAMIN D PO  Take 1 tablet by mouth daily.        VVS, Skin clean, dry and intact without evidence of skin break  down, no evidence of skin tears noted.  IV catheter discontinued intact. Site without signs and symptoms of complications. Dressing and pressure applied.  An After Visit Summary was printed and given to the patient.  Patient escorted via WC, and D/C home via private auto.  [name], [name]  01/03/2014 2:25 PM

## 2014-01-03 NOTE — Evaluation (Signed)
Physical Therapy Evaluation Patient Details Name: [name] Mcgee MRN: [id number] DOB: 1960-06-09 Today's Date: 01/03/2014   History of Present Illness  Pt 53 yo female admitted 12/28 for elective L TKA, h/o R TKA 11/2012.  Clinical Impression  Pt is s/p TKA resulting in the deficits listed below (see PT Problem List). Pt tolerating OOB mobility well for first time. Anticipate pt to be safe for d/c home once medically stable. Pt will benefit from skilled PT to increase their independence and safety with mobility to allow discharge to the venue listed below.      Follow Up Recommendations Home health PT;Supervision/Assistance - 24 hour    Equipment Recommendations  None recommended by PT (pt has equip at home)    Recommendations for Other Services       Precautions / Restrictions Precautions Precautions: Fall Restrictions Weight Bearing Restrictions: Yes LLE Weight Bearing: Weight bearing as tolerated      Mobility  Bed Mobility Overal bed mobility: Modified Independent             General bed mobility comments: increased time  Transfers Overall transfer level: Needs assistance Equipment used: Rolling walker (2 wheeled) Transfers: Sit to/from Stand Sit to Stand: Min guard         General transfer comment: v/c's for safe hand placement  Ambulation/Gait Ambulation/Gait assistance: Min guard Ambulation Distance (Feet): 100 Feet Assistive device: Rolling walker (2 wheeled) Gait Pattern/deviations: Step-to pattern;Decreased step length - left;Antalgic   Gait velocity interpretation: Below normal speed for age/gender    Stairs            Wheelchair Mobility    Modified Rankin (Stroke Patients Only)       Balance                                             Pertinent Vitals/Pain Pain Assessment: 0-10 Pain Score: 3  Pain Location: L knee Pain Descriptors / Indicators: Tightness Pain Intervention(s): Monitored during session     Home Living Family/patient expects to be discharged to:: Private residence Living Arrangements: Spouse/significant other Available Help at Discharge: Family;Available 24 hours/day Type of Home: House Home Access: Stairs to enter Entrance Stairs-Rails: Can reach both Entrance Stairs-Number of Steps: 1 Home Layout: Able to live on main level with bedroom/bathroom Home Equipment: Walker - 2 wheels;Bedside commode      Prior Function Level of Independence: Independent               Hand Dominance   Dominant Hand: Right    Extremity/Trunk Assessment   Upper Extremity Assessment: Overall WFL for tasks assessed           Lower Extremity Assessment: LLE deficits/detail   LLE Deficits / Details: able to do quad set  Cervical / Trunk Assessment: Normal  Communication   Communication: No difficulties  Cognition Arousal/Alertness: Awake/alert Behavior During Therapy: WFL for tasks assessed/performed Overall Cognitive Status: Within Functional Limits for tasks assessed                      General Comments      Exercises Total Joint Exercises Ankle Circles/Pumps: AROM;Both;10 reps;Supine Quad Sets: AROM;Left;10 reps;Supine Heel Slides: AROM;Left;10 reps;Supine;AAROM;Seated Goniometric ROM: 70 L knee flex      Assessment/Plan    PT Assessment Patient needs continued PT services  PT Diagnosis Difficulty walking;Acute pain  PT Problem List Decreased strength;Decreased range of motion;Decreased activity tolerance;Decreased balance;Decreased mobility  PT Treatment Interventions DME instruction;Gait training;Stair training;Functional mobility training;Therapeutic activities;Therapeutic exercise   PT Goals (Current goals can be found in the Care Plan section) Acute Rehab PT Goals Patient Stated Goal: home PT Goal Formulation: With patient Time For Goal Achievement: 01/10/14 Potential to Achieve Goals: Good    Frequency 7X/week   Barriers to  discharge        Co-evaluation               End of Session Equipment Utilized During Treatment: Gait belt Activity Tolerance: Patient tolerated treatment well Patient left: in chair;with call bell/phone within reach;with family/visitor present Nurse Communication: Mobility status         Time: 0802-0827 PT Time Calculation (min) (ACUTE ONLY): 25 min   Charges:   PT Evaluation $Initial PT Evaluation Tier I: 1 Procedure PT Treatments $Therapeutic Exercise: 8-22 mins   PT G CodesMarcene Brawn:        Jeania Nater Marie 01/03/2014, 9:06 AM   Lewis ShockAshly Kamela Blansett, PT, DPT Pager #: [phone] Office #: [phone]

## 2014-01-03 NOTE — Progress Notes (Signed)
Physical Therapy Treatment Patient Details Name: Otila BackDeborah Agostini MRN: [id number] DOB: 06-21-1960 Today's Date: 01/03/2014    History of Present Illness Pt 53 yo female admitted 12/28 for elective L TKA, h/o R TKA 11/2012.    PT Comments    Pt progressing extremely well. Pt does require reminders to use RW due to just having had surgery yesterday 12/28. Pt with good return demo on stairs and is safe to d/c home with spouse once medically stable.  Follow Up Recommendations  Home health PT;Supervision/Assistance - 24 hour     Equipment Recommendations  None recommended by PT    Recommendations for Other Services       Precautions / Restrictions Precautions Precautions: Fall;Knee Precaution Comments: pt aware to use bone foam Restrictions Weight Bearing Restrictions: Yes LLE Weight Bearing: Weight bearing as tolerated    Mobility  Bed Mobility                  Transfers Overall transfer level: Needs assistance Equipment used: Rolling walker (2 wheeled) Transfers: Sit to/from Stand Sit to Stand: Supervision         General transfer comment: v/c's to not stand up without walker, pt slightly impulsive  Ambulation/Gait Ambulation/Gait assistance: Supervision Ambulation Distance (Feet): 400 Feet Assistive device: Rolling walker (2 wheeled) Gait Pattern/deviations: Step-through pattern;Decreased stance time - left     General Gait Details: pt ambulating with ease, increased UE weight-bearing towards end of ambulation due to fatigue. no episodes of L knee buckling   Stairs Stairs: Yes Stairs assistance: Min guard Stair Management: One rail Right;Forwards Number of Stairs: 2 General stair comments: v/c's for "up with the good, down with the bad sequence". no buckling  Wheelchair Mobility    Modified Rankin (Stroke Patients Only)       Balance                                    Cognition Arousal/Alertness: Awake/alert Behavior During  Therapy: WFL for tasks assessed/performed Overall Cognitive Status: Within Functional Limits for tasks assessed                      Exercises      General Comments        Pertinent Vitals/Pain Pain Assessment: 0-10 Pain Score: 2  Pain Location: L knee Pain Descriptors / Indicators: Sore Pain Intervention(s): Monitored during session    Home Living                      Prior Function            PT Goals (current goals can now be found in the care plan section) Progress towards PT goals: Progressing toward goals    Frequency  7X/week    PT Plan Current plan remains appropriate    Co-evaluation             End of Session Equipment Utilized During Treatment: Gait belt Activity Tolerance: Patient tolerated treatment well Patient left: in chair;with call bell/phone within reach;with family/visitor present     Time: 1210-1225 PT Time Calculation (min) (ACUTE ONLY): 15 min  Charges:  $Gait Training: 8-22 mins                    G Codes:      Marcene BrawnChadwell, Annabeth Tortora Marie 01/03/2014, 1:04 PM  Lewis ShockAshly Joyceann Kruser, PT, DPT Pager #: [phone]  Office #: [phone]

## 2014-01-03 NOTE — Evaluation (Signed)
Occupational Therapy Evaluation Patient Details Name: [name] Mcgee MRN: [id number] DOB: [birth year] Today's Date: 01/03/2014    History of Present Illness Pt 53 yo female admitted 12/28 for elective L TKA, h/o R TKA 11/2012.   Clinical Impression   Pt requires supervision for ADL transfers and min assist for LB bathing and dressing.  Educated in use of AE, transporting items with walker, and home safety.  Pt with reliable assist of her husband at home.  All DME needs are met from her previous TKA.  No further OT     Follow Up Recommendations  No OT follow up    Equipment Recommendations  None recommended by OT    Recommendations for Other Services       Precautions / Restrictions Precautions Precautions: Fall;Knee Precaution Comments: pt aware to use bone foam Restrictions Weight Bearing Restrictions: Yes LLE Weight Bearing: Weight bearing as tolerated      Mobility Bed Mobility               General bed mobility comments: pt received in chair  Transfers Overall transfer level: Needs assistance Equipment used: Rolling walker (2 wheeled) Transfers: Sit to/from Stand Sit to Stand: Supervision         General transfer comment: v/c's to not stand up without walker, pt slightly impulsive    Balance                                            ADL Overall ADL's : Needs assistance/impaired Eating/Feeding: Independent   Grooming: Wash/dry hands;Standing;Supervision/safety   Upper Body Bathing: Set up;Sitting   Lower Body Bathing: Minimal assistance;Sit to/from stand   Upper Body Dressing : Set up;Sitting   Lower Body Dressing: Minimal assistance;Sit to/from stand   Toilet Transfer: Supervision/safety;Ambulation;RW;Comfort height toilet   Toileting- Clothing Manipulation and Hygiene: Supervision/safety;Sit to/from stand       Functional mobility during ADLs: Supervision/safety;Rolling walker General ADL Comments: Educated pt on  availability of AE for LB and dressing.  Pt will rely on her husband to assist until she is able to perform on her own.     Vision                     Perception     Praxis      Pertinent Vitals/Pain Pain Assessment: Faces Pain Score: 2  Faces Pain Scale: Hurts a little bit Pain Location: l knee Pain Descriptors / Indicators: Sore Pain Intervention(s): Monitored during session;Premedicated before session     Hand Dominance Right   Extremity/Trunk Assessment Upper Extremity Assessment Upper Extremity Assessment: Overall WFL for tasks assessed   Lower Extremity Assessment Lower Extremity Assessment: Defer to PT evaluation   Cervical / Trunk Assessment Cervical / Trunk Assessment: Normal   Communication Communication Communication: No difficulties   Cognition Arousal/Alertness: Awake/alert Behavior During Therapy: WFL for tasks assessed/performed Overall Cognitive Status: Within Functional Limits for tasks assessed                     General Comments       Exercises       Shoulder Instructions      Home Living Family/patient expects to be discharged to:: Private residence Living Arrangements: Spouse/significant other Available Help at Discharge: Family;Available 24 hours/day Type of Home: House Home Access: Stairs to enter [employer] of Steps: 1 Entrance Stairs-Rails:  Can reach both Home Layout: Able to live on main level with bedroom/bathroom     Bathroom Shower/Tub: Tub/shower unit   Bathroom Toilet: Handicapped height     Home Equipment: Environmental consultant - 2 wheels;Bedside commode;Tub bench;Grab bars - tub/shower          Prior Functioning/Environment Level of Independence: Independent             OT Diagnosis:     OT Problem List:     OT Treatment/Interventions:      OT Goals(Current goals can be found in the care plan section) Acute Rehab OT Goals Patient Stated Goal: home  OT Frequency:     Barriers to D/C:             Co-evaluation              End of Session Equipment Utilized During Treatment: Rolling walker  Activity Tolerance: Patient tolerated treatment well Patient left: in chair;with call bell/phone within reach;with family/visitor present   Time: 1250-1305 OT Time Calculation (min): 15 min Charges:  OT General Charges $OT Visit: 1 Procedure OT Evaluation $Initial OT Evaluation Tier I: 1 Procedure G-Codes:    Malka So 01/03/2014, 1:26 PM  [phone]

## 2014-01-05 NOTE — Anesthesia Postprocedure Evaluation (Signed)
  Anesthesia Post-op Note  Patient: [name] BackDeborah Mcgee  Procedure(s) Performed: Procedure(s): LEFT TOTAL KNEE ARTHROPLASTY (Left)  Patient Location: PACU  Anesthesia Type:Spinal and MAC combined with regional for post-op pain  Level of Consciousness: awake, alert  and oriented  Airway and Oxygen Therapy: Patient Spontanous Breathing  Post-op Pain: mild  Post-op Assessment: Post-op Vital signs reviewed, Patient's Cardiovascular Status Stable, Respiratory Function Stable, Patent Airway, No signs of Nausea or vomiting and Pain level controlled  Post-op Vital Signs: stable  Last Vitals:  Filed Vitals:   01/03/14 1300  BP: 115/71  Pulse: 107  Temp: 36.7 C  Resp: 16    Complications: No apparent anesthesia complications

## 2015-01-29 ENCOUNTER — Other Ambulatory Visit: Payer: Self-pay | Admitting: Gastroenterology

## 2015-01-29 DIAGNOSIS — R634 Abnormal weight loss: Secondary | ICD-10-CM

## 2015-02-02 ENCOUNTER — Other Ambulatory Visit: Payer: No Typology Code available for payment source

## 2015-02-08 ENCOUNTER — Ambulatory Visit
Admission: RE | Admit: 2015-02-08 | Discharge: 2015-02-08 | Disposition: A | Discharge status: Home / Self Care | Payer: Medicare HMO | Source: Ambulatory Visit | Attending: Gastroenterology | Admitting: Gastroenterology

## 2015-02-08 DIAGNOSIS — R634 Abnormal weight loss: Secondary | ICD-10-CM

## 2015-02-08 MED ORDER — IOPAMIDOL (ISOVUE-300) INJECTION 61%
125.0000 mL | Freq: Once | INTRAVENOUS | Status: AC | PRN
  Administered 2015-02-08: 125 mL via INTRAVENOUS

## 2016-05-18 IMAGING — CT CT ABD-PELV W/ CM
2 of 5 series · 15 of 46 positions shown, 17 images · IV contrast (iopamidol)
Comparison: None.

CLINICAL DATA: Weight loss for the past 8 months, 40 pounds.
Constipation.

EXAM:
CT ABDOMEN AND PELVIS WITH CONTRAST
TECHNIQUE: Multidetector CT imaging of the abdomen and pelvis was performed
using the standard protocol following bolus administration of
intravenous contrast.
CONTRAST:  125mL P6VSHG-9FF IOPAMIDOL (P6VSHG-9FF) INJECTION 61%

[Series 2: abd/pelvis w/cm · axial · 0.74mm/px · z∈[+702,+1107]mm · 12 of 91 slices shown, 14 images]
[im 5/91  soft-tissue]
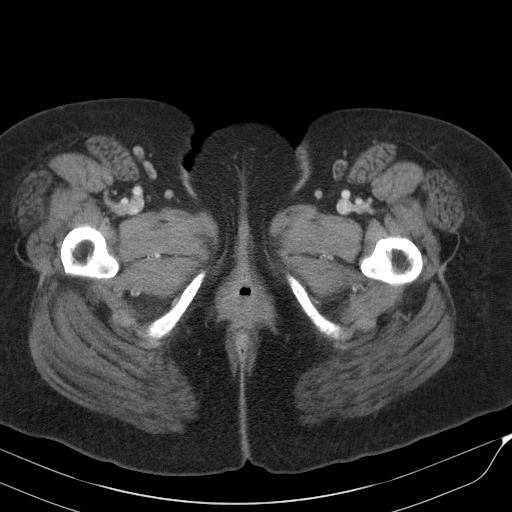
[im 5/91  bone]
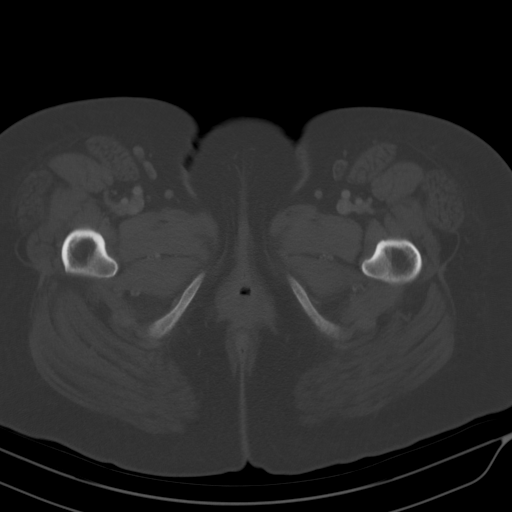
[im 15/91  soft-tissue]
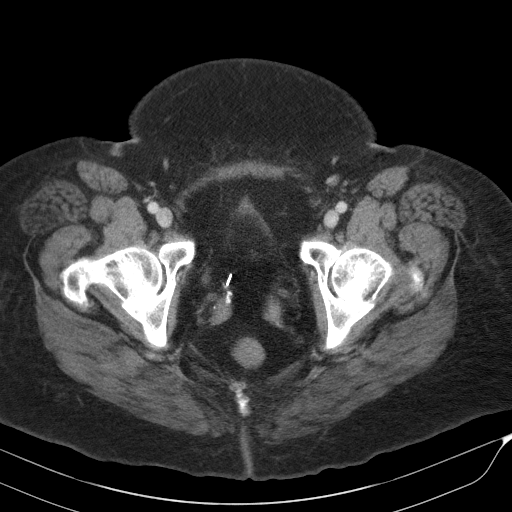
[im 19/91  soft-tissue]
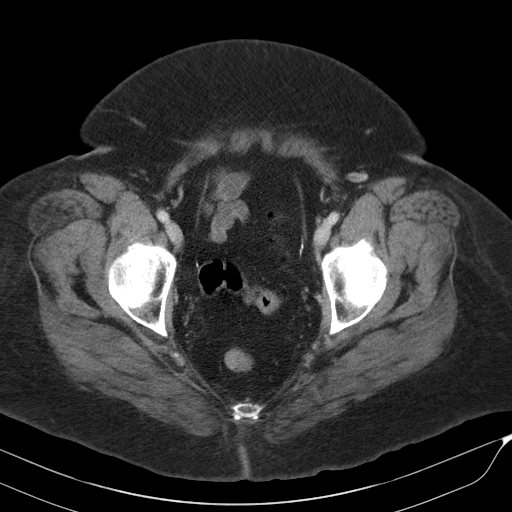
[im 29/91  soft-tissue]
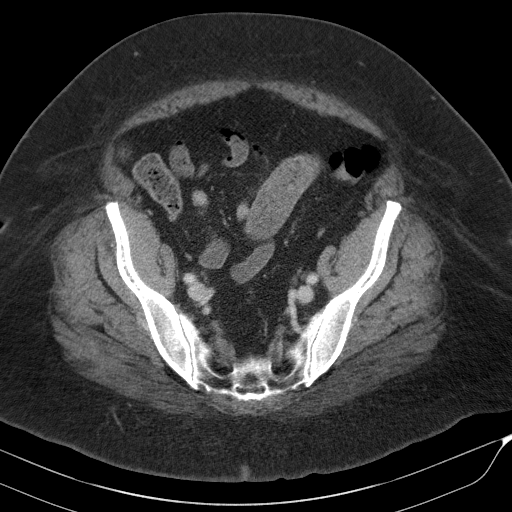
[im 34/91  soft-tissue]
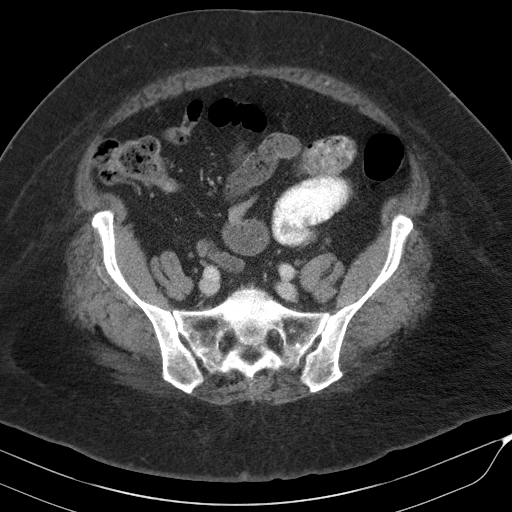
[im 43/91  soft-tissue]
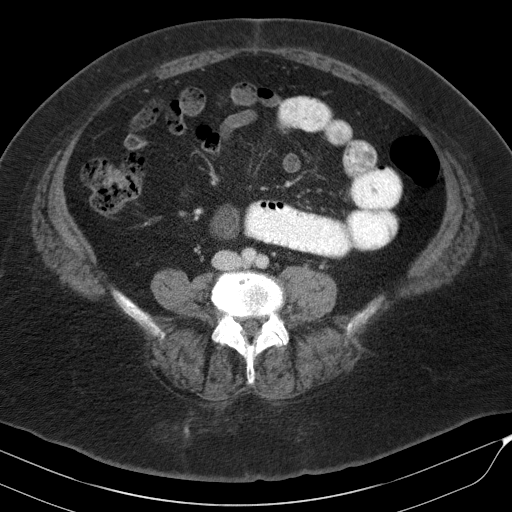
[im 48/91  soft-tissue]
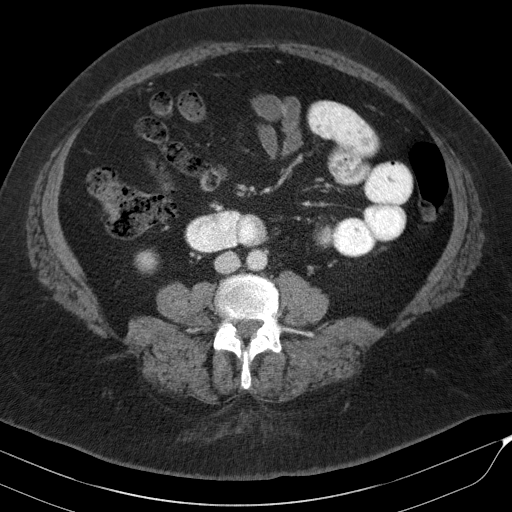
[im 57/91  soft-tissue]
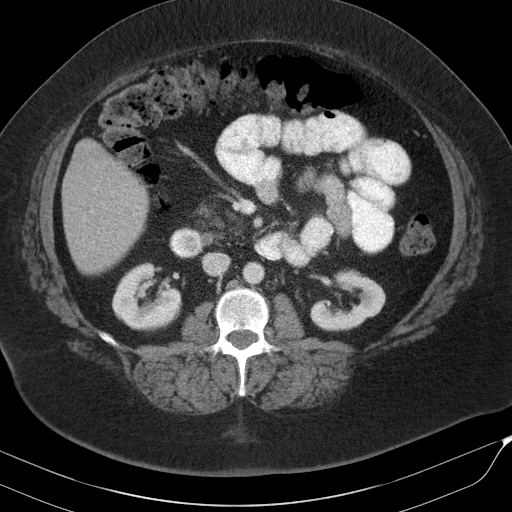
[im 62/91  soft-tissue]
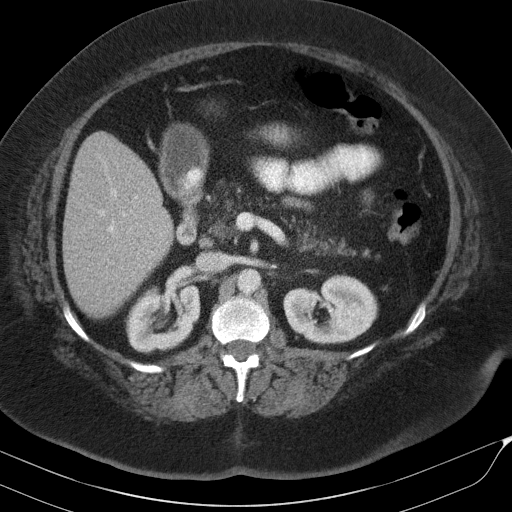
[im 62/91  bone]
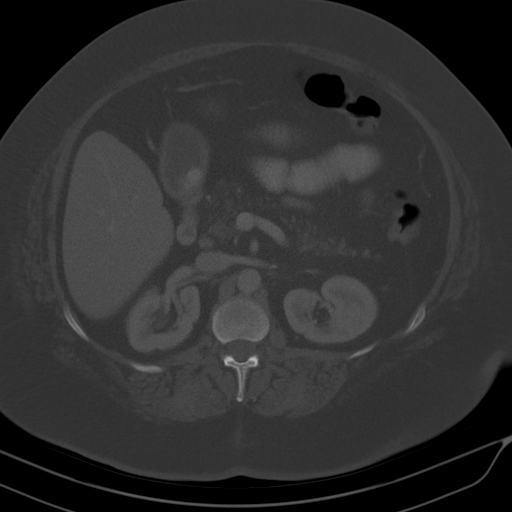
[im 72/91  soft-tissue]
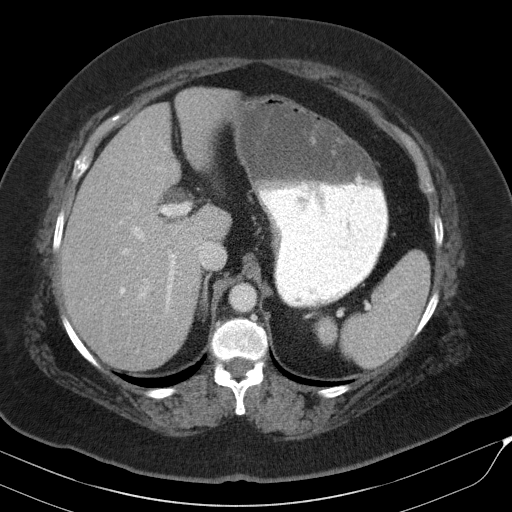
[im 76/91  soft-tissue]
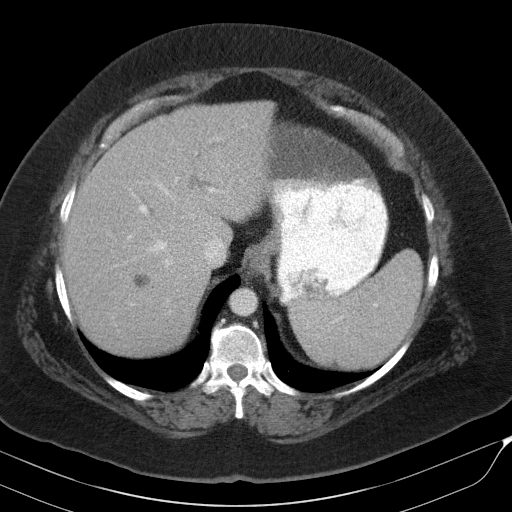
[im 86/91  soft-tissue]
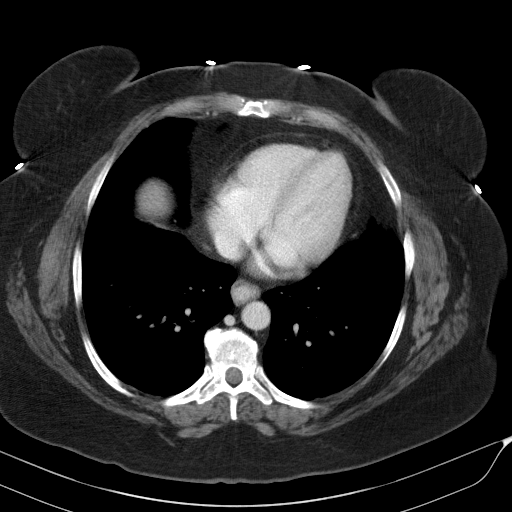

[Series 3: cor · coronal · 0.73mm/px · 3 of 118 slices shown]
[im 40/118  soft-tissue]
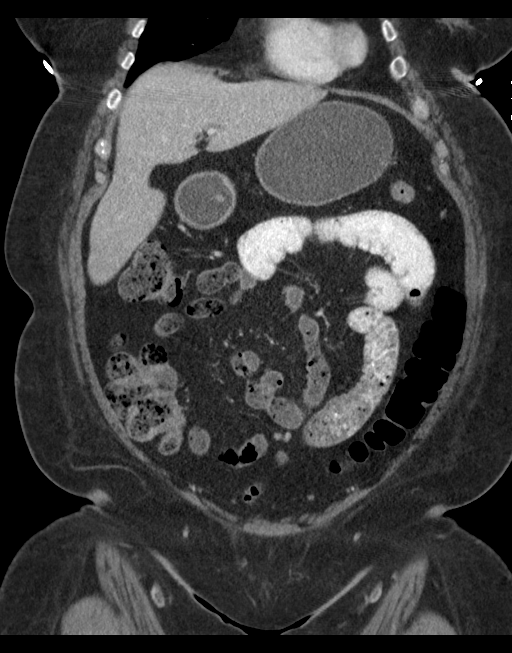
[im 53/118  soft-tissue]
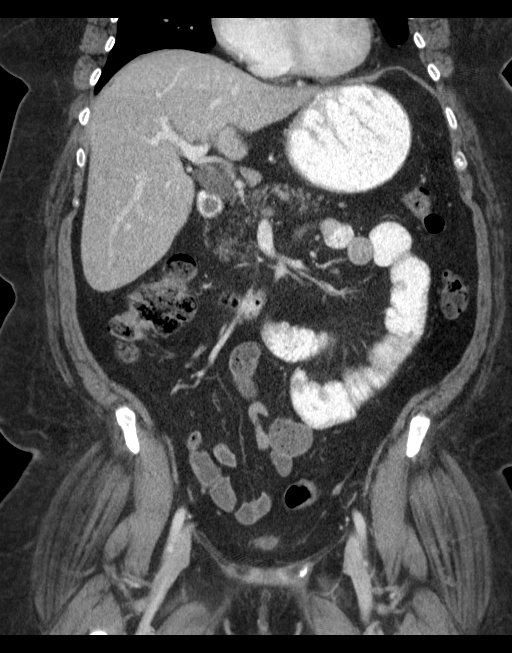
[im 66/118  soft-tissue]
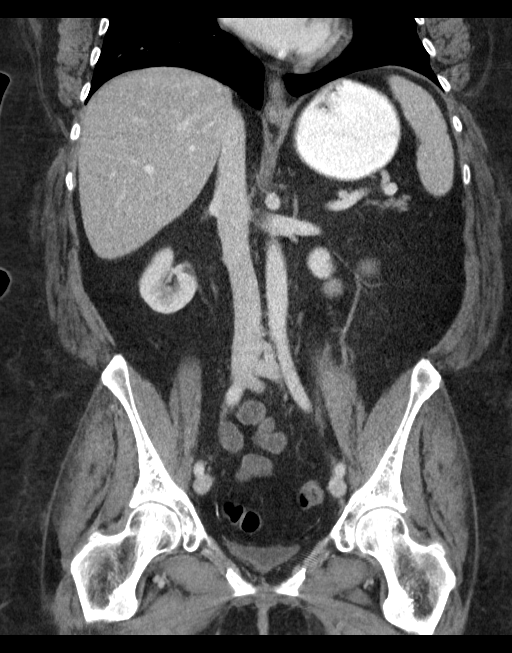

[15 of 46 positions shown; findings below may reference images not displayed]

FINDINGS: Lower chest: Clear lung bases. Borderline cardiomegaly, without
pericardial or pleural effusion. The distal esophagus appears thick
walled, including on image 4/series 2.

Hepatobiliary: Right hepatic lobe well-circumscribed 9 mm lesion is
likely a cyst. Cholecystectomy. The common duct is mildly dilated
for prior cholecystectomy state. Example 14 mm on coronal image 51.
No intrahepatic duct dilatation identified. No obstructive stone or
mass. Duct tapers gradually in the region of the pancreatic head.

Pancreas: Fatty replacement involving the pancreas, most apparent in
the head and uncinate process. No duct dilatation.

Spleen: Normal in size, without focal abnormality.

Adrenals/Urinary Tract: Normal adrenal glands. At least partially
duplicated renal collecting systems bilaterally. No hydronephrosis
or focal renal abnormality. Decompressed urinary bladder.

Stomach/Bowel: Normal stomach, without wall thickening. Large
colonic stool burden, especially proximally. Mid small bowel loops
measure upper normal, including 3.0 cm. No focal transition point
identified. Ileal loops have solid stool like material within,
suggesting constipation.

Vascular/Lymphatic: Normal caliber of the aorta and branch vessels.
No abdominopelvic adenopathy.

Reproductive: Hysterectomy.  No adnexal mass.

Other: No significant free fluid.

Musculoskeletal: Degenerate disc disease at L4-5.
IMPRESSION: 1.  No acute process or explanation for weight loss
2. Distal esophageal wall thickening, mild, suggesting esophagitis.
3. Large colonic stool burden is most consistent with constipation.
There is solid stool like material in the distal small bowel loops,
also suggesting clinical constipation. More proximal small bowel
loops are borderline dilated, but no focal transition point is
identified. Presuming the patient is asymptomatic, this is likely
within normal variation.
4. Cholecystectomy with common duct dilatation. No obstructive cause
identified. Correlate with bilirubin levels. If these are normal,
this is likely within normal variation for this patient. If
bilirubin is elevated, MRCP or ERCP should be considered.
